# Patient Record
Sex: Male | Born: 1970
Health system: Southern US, Community
[De-identification: ages and names within clinical notes are randomized; demographics above are authoritative.]

## PROBLEM LIST (undated history)

## (undated) DIAGNOSIS — R61 Generalized hyperhidrosis: Secondary | ICD-10-CM

## (undated) DIAGNOSIS — F329 Major depressive disorder, single episode, unspecified: Secondary | ICD-10-CM

## (undated) DIAGNOSIS — S82402A Unspecified fracture of shaft of left fibula, initial encounter for closed fracture: Secondary | ICD-10-CM

## (undated) DIAGNOSIS — I2699 Other pulmonary embolism without acute cor pulmonale: Secondary | ICD-10-CM

## (undated) DIAGNOSIS — F1010 Alcohol abuse, uncomplicated: Secondary | ICD-10-CM

## (undated) DIAGNOSIS — Z87891 Personal history of nicotine dependence: Secondary | ICD-10-CM

## (undated) DIAGNOSIS — I82419 Acute embolism and thrombosis of unspecified femoral vein: Secondary | ICD-10-CM

## (undated) DIAGNOSIS — F419 Anxiety disorder, unspecified: Secondary | ICD-10-CM

## (undated) DIAGNOSIS — S0285XA Fracture of orbit, unspecified, initial encounter for closed fracture: Secondary | ICD-10-CM

## (undated) DIAGNOSIS — G4733 Obstructive sleep apnea (adult) (pediatric): Secondary | ICD-10-CM

## (undated) DIAGNOSIS — E781 Pure hyperglyceridemia: Secondary | ICD-10-CM

## (undated) HISTORY — DX: Alcohol abuse, uncomplicated: F10.10

## (undated) HISTORY — DX: Fracture of orbit, unspecified, initial encounter for closed fracture: S02.85XA

## (undated) HISTORY — DX: Pure hyperglyceridemia: E78.1

## (undated) HISTORY — PX: VASECTOMY: SHX75

## (undated) HISTORY — DX: Unspecified fracture of shaft of left fibula, initial encounter for closed fracture: S82.402A

## (undated) HISTORY — DX: Generalized hyperhidrosis: R61

## (undated) HISTORY — DX: Obstructive sleep apnea (adult) (pediatric): G47.33

## (undated) HISTORY — PX: TONSILECTOMY, ADENOIDECTOMY, BILATERAL MYRINGOTOMY AND TUBES: SHX2538

## (undated) HISTORY — DX: Anxiety disorder, unspecified: F41.9

## (undated) HISTORY — DX: Personal history of nicotine dependence: Z87.891

## (undated) HISTORY — DX: Major depressive disorder, single episode, unspecified: F32.9

---
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
## 2012-09-27 DIAGNOSIS — F32A Depression, unspecified: Secondary | ICD-10-CM

## 2012-09-27 HISTORY — DX: Depression, unspecified: F32.A

## 2013-04-06 ENCOUNTER — Ambulatory Visit (INDEPENDENT_AMBULATORY_CARE_PROVIDER_SITE_OTHER): Payer: BC Managed Care – PPO | Admitting: Family Medicine

## 2013-04-06 ENCOUNTER — Encounter: Payer: Self-pay | Admitting: Family Medicine

## 2013-04-06 VITALS — BP 122/83 | HR 79 | Temp 99.8°F | Resp 18 | Ht 71.5 in | Wt 183.0 lb

## 2013-04-06 DIAGNOSIS — R5383 Other fatigue: Secondary | ICD-10-CM

## 2013-04-06 DIAGNOSIS — R61 Generalized hyperhidrosis: Secondary | ICD-10-CM

## 2013-04-06 DIAGNOSIS — L74519 Primary focal hyperhidrosis, unspecified: Secondary | ICD-10-CM

## 2013-04-06 DIAGNOSIS — R5381 Other malaise: Secondary | ICD-10-CM

## 2013-04-06 DIAGNOSIS — F321 Major depressive disorder, single episode, moderate: Secondary | ICD-10-CM

## 2013-04-06 LAB — CBC WITH DIFFERENTIAL/PLATELET
Basophils Absolute: 0 10*3/uL (ref 0.0–0.1)
Eosinophils Absolute: 0.1 10*3/uL (ref 0.0–0.7)
HCT: 47.8 % (ref 39.0–52.0)
Lymphs Abs: 1.7 10*3/uL (ref 0.7–4.0)
MCHC: 33.6 g/dL (ref 30.0–36.0)
MCV: 94.1 fl (ref 78.0–100.0)
Monocytes Absolute: 0.8 10*3/uL (ref 0.1–1.0)
Neutrophils Relative %: 74.3 % (ref 43.0–77.0)
Platelets: 230 10*3/uL (ref 150.0–400.0)
RDW: 12.9 % (ref 11.5–14.6)

## 2013-04-06 LAB — COMPREHENSIVE METABOLIC PANEL
ALT: 14 U/L (ref 0–53)
Albumin: 4.6 g/dL (ref 3.5–5.2)
Alkaline Phosphatase: 61 U/L (ref 39–117)
Potassium: 3.9 mEq/L (ref 3.5–5.1)
Sodium: 138 mEq/L (ref 135–145)
Total Bilirubin: 0.7 mg/dL (ref 0.3–1.2)
Total Protein: 7.7 g/dL (ref 6.0–8.3)

## 2013-04-06 LAB — TSH: TSH: 0.59 u[IU]/mL (ref 0.35–5.50)

## 2013-04-06 MED ORDER — CITALOPRAM HYDROBROMIDE 20 MG PO TABS: 20.0000 mg | ORAL_TABLET | Freq: Every day | ORAL | Status: DC

## 2013-04-06 NOTE — Progress Notes (Addendum)
Office Note 05/06/2013  CC:  Chief Complaint  Patient presents with  . Establish Care    HPI:  Tyrone Page is a 42 y.o. White male who is here to establish care and get CPE. Patient's most recent primary MD: none Old records were not reviewed prior to or during today's visit.  Due to significant problems discussed today we changed today's focus to this rather than a CPE/health maintenance exam.  Complains of onset of depressed/sad mood on a regular basis about 2 yrs ago.  Worsening/peaking lately.  Very irritable some days, +anhedonia, decreased focus/attention, poor sleep initiation and maintenance, no sex drive (but denies erectile dysfunction).  No periods of hypomania or mania.   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   Also reports hx of excessive sweating "all over" chronically. Denies any hx of being on an antidepressant or seeing psychiatrist or counselor.  No SI or HI.  Past Medical History  Diagnosis Date  . Alcohol abuse     in his 71s  . History of tobacco abuse     e cig use    Past Surgical History  Procedure Laterality Date  . Tonsilectomy, adenoidectomy, bilateral myringotomy and tubes    . Vasectomy      Family History  Problem Relation Age of Onset  . COPD Mother   . Diabetes Maternal Grandmother   . Cancer Maternal Grandfather     History   Social History  . Marital Status: Married    Spouse Name: N/A    Number of Children: N/A  . Years of Education: N/A   Occupational History  . Not on file.   Social History Main Topics  . Smoking status: Former Smoker    Quit date: 11/08/2011  . Smokeless tobacco: Never Used  . Alcohol Use: Yes  . Drug Use: Yes    Special: Marijuana  . Sexually Active: Not on file   Other Topics Concern  . Not on file   Social History Narrative   Married, 3 children.   Orig from [place].   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   Former smoker: quit 2012.  40 pack-yr hx.   Rare alcohol currently--history of  alc abuse in 43s (multiple DUI's, etc).  Marijuana smoker his whole life--evenings now, for relaxation and sleep.   Occupation: Arboriculturist.   MEDS: none  No Known Allergies  ROS Review of Systems  Constitutional: Positive for fatigue. Negative for fever.  HENT: Negative for congestion and sore throat.   Eyes: Negative for visual disturbance.  Respiratory: Negative for cough.   Cardiovascular: Negative for chest pain.  Gastrointestinal: Negative for nausea and abdominal pain.  Genitourinary: Negative for dysuria.  Musculoskeletal: Negative for back pain and joint swelling.  Skin: Negative for rash.  Neurological: Negative for weakness and headaches.  Hematological: Negative for adenopathy.     PE; Blood pressure 122/83, pulse 79, temperature 99.8 F (37.7 C), temperature source Temporal, resp. rate 18, height 5' 11.5" (1.816 m), weight 183 lb (83.008 kg), SpO2 99.00%. EXAM Pertinent labs:  None today  ASSESSMENT AND PLAN:   New Pt today: no old records to obtain.  Major depressive disorder, single episode, moderate Citalopram 20mg  po qd.  Therapeutic expectations and side effect profile of medication discussed today.  Patient's questions answered. Check TSH, CMET, CBC. Recheck 1 mo.  An After Visit Summary was printed and given to the patient.  Return in about 1 month (around 05/07/2013) for f/u  depression.

## 2013-04-26 DIAGNOSIS — R5383 Other fatigue: Secondary | ICD-10-CM | POA: Insufficient documentation

## 2013-04-26 DIAGNOSIS — R61 Generalized hyperhidrosis: Secondary | ICD-10-CM | POA: Insufficient documentation

## 2013-04-26 DIAGNOSIS — F321 Major depressive disorder, single episode, moderate: Secondary | ICD-10-CM | POA: Insufficient documentation

## 2013-05-04 ENCOUNTER — Ambulatory Visit (INDEPENDENT_AMBULATORY_CARE_PROVIDER_SITE_OTHER): Payer: BC Managed Care – PPO | Admitting: Family Medicine

## 2013-05-04 ENCOUNTER — Encounter: Payer: Self-pay | Admitting: Family Medicine

## 2013-05-04 VITALS — BP 127/78 | HR 65 | Temp 97.6°F | Resp 16 | Ht 71.5 in | Wt 185.0 lb

## 2013-05-04 DIAGNOSIS — F321 Major depressive disorder, single episode, moderate: Secondary | ICD-10-CM

## 2013-05-04 MED ORDER — CITALOPRAM HYDROBROMIDE 20 MG PO TABS: 20.0000 mg | ORAL_TABLET | Freq: Every day | ORAL | Status: DC

## 2013-05-04 NOTE — Progress Notes (Signed)
OFFICE NOTE  05/06/2013  CC:  Chief Complaint  Patient presents with  . Follow-up    depression     HPI: Patient is a 42 y.o. Caucasian male who is here for 1 mo f/u depression-started citalopram 20mg  qd last visit. Feels better regarding sleep and feels more clear headed and focused, relaxed more.  Felt onset of improvement in about a week.  No euphoria or excess energy or irritability to suggest hypomania or mania. Having some diarrhea: had 2-3 watery BMs per day for the first week.  In the last 3-4 days it has let up some-getting more formed and having only one per day now. Coincidentally has had more right sided neck muscle pain and stiffness.  Pertinent PMH:  Past Medical History  Diagnosis Date  . Alcohol abuse     in his 3s  . History of tobacco abuse     e cig use  . Hyperhydrosis disorder   . Depression 2014   Past surgical, social, and family history reviewed and no changes noted since last office visit.  MEDS:  Outpatient Prescriptions Prior to Visit  Medication Sig Dispense Refill  . citalopram (CELEXA) 20 MG tablet Take 1 tablet (20 mg total) by mouth daily.  30 tablet  1   No facility-administered medications prior to visit.    PE: Blood pressure 127/78, pulse 65, temperature 97.6 F (36.4 C), temperature source Temporal, resp. rate 16, height 5' 11.5" (1.816 m), weight 185 lb (83.915 kg), SpO2 98.00%. Wt Readings from Last 2 Encounters:  05/04/13 185 lb (83.915 kg)  04/06/13 183 lb (83.008 kg)    Gen: alert, oriented x 4, affect pleasant.  Lucid thinking and conversation noted. HEENT: PERRLA, EOMI.   Neck: no LAD, mass, or thyromegaly.  ROM fully intact, nontender neck soft tissues. CV: RRR, no m/r/g LUNGS: CTA bilat, nonlabored. NEURO: no tremor or tics noted on observation.  Coordination intact. CN 2-12 grossly intact bilaterally, strength 5/5 in all extremeties.  No ataxia.   IMPRESSION AND PLAN:  Major depressive disorder, single episode,  moderate Much improved. Continue citalopram 20 mg qd. Recheck in 2 mo for CPE and fasting labs.

## 2013-05-06 ENCOUNTER — Encounter: Payer: Self-pay | Admitting: Family Medicine

## 2013-05-06 NOTE — Assessment & Plan Note (Signed)
Citalopram 20mg  po qd.  Therapeutic expectations and side effect profile of medication discussed today.  Patient's questions answered. Check TSH, CMET, CBC. Recheck 1 mo.

## 2013-05-06 NOTE — Assessment & Plan Note (Signed)
Much improved. Continue citalopram 20 mg qd. Recheck in 2 mo for CPE and fasting labs.

## 2013-07-04 ENCOUNTER — Encounter: Payer: Self-pay | Admitting: Family Medicine

## 2013-07-04 ENCOUNTER — Ambulatory Visit (INDEPENDENT_AMBULATORY_CARE_PROVIDER_SITE_OTHER): Payer: BC Managed Care – PPO | Admitting: Family Medicine

## 2013-07-04 VITALS — BP 139/78 | HR 65 | Temp 98.1°F | Resp 16 | Ht 71.5 in | Wt 182.0 lb

## 2013-07-04 DIAGNOSIS — R7989 Other specified abnormal findings of blood chemistry: Secondary | ICD-10-CM

## 2013-07-04 DIAGNOSIS — Z0000 Encounter for general adult medical examination without abnormal findings: Secondary | ICD-10-CM

## 2013-07-04 LAB — LIPID PANEL
Cholesterol: 224 mg/dL — ABNORMAL HIGH (ref 0–200)
Total CHOL/HDL Ratio: 4

## 2013-07-04 NOTE — Progress Notes (Signed)
Office Note 07/04/2013  CC:  Chief Complaint  Patient presents with  . Annual Exam    HPI:  Tyrone Page is a 42 y.o. White male who is here for CPE. Feels better than he has in a couple of years and he attributes this to the citalopram.  No side effects.   Past Medical History  Diagnosis Date  . Alcohol abuse     in his 58s  . History of tobacco abuse     e cig use  . Hyperhydrosis disorder   . Depression 2014    Past Surgical History  Procedure Laterality Date  . Tonsilectomy, adenoidectomy, bilateral myringotomy and tubes    . Vasectomy      Family History  Problem Relation Age of Onset  . COPD Mother   . Diabetes Maternal Grandmother   . Cancer Maternal Grandfather     History   Social History  . Marital Status: Married    Spouse Name: N/A    Number of Children: N/A  . Years of Education: N/A   Occupational History  . Not on file.   Social History Main Topics  . Smoking status: Former Smoker    Quit date: 11/08/2011  . Smokeless tobacco: Never Used  . Alcohol Use: Yes  . Drug Use: Yes    Special: Marijuana  . Sexual Activity: Not on file   Other Topics Concern  . Not on file   Social History Narrative   Married, 3 children.   Orig from [place].   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   Former smoker: quit 2012.  40 pack-yr hx.   Rare alcohol currently--history of alc abuse in 51s (multiple DUI's, etc).  Marijuana smoker his whole life--evenings now, for relaxation and sleep.   Occupation: Arboriculturist.    Outpatient Prescriptions Prior to Visit  Medication Sig Dispense Refill  . citalopram (CELEXA) 20 MG tablet Take 1 tablet (20 mg total) by mouth daily.  30 tablet  6   No facility-administered medications prior to visit.    No Known Allergies  ROS Review of Systems  Constitutional: Negative for fever, chills, appetite change and fatigue.  HENT: Negative for congestion, dental problem, ear pain and sore throat.    Eyes: Negative for discharge, redness and visual disturbance.  Respiratory: Negative for cough, chest tightness, shortness of breath and wheezing.   Cardiovascular: Negative for chest pain, palpitations and leg swelling.  Gastrointestinal: Negative for nausea, vomiting, abdominal pain, diarrhea and blood in stool.  Genitourinary: Negative for dysuria, urgency, frequency, hematuria, flank pain and difficulty urinating.  Musculoskeletal: Negative for arthralgias, back pain, joint swelling, myalgias and neck stiffness.  Skin: Negative for pallor and rash.  Neurological: Negative for dizziness, speech difficulty, weakness and headaches.  Hematological: Negative for adenopathy. Does not bruise/bleed easily.  Psychiatric/Behavioral: Positive for sleep disturbance (gets up one time per night to urinate and has trouble getting back to sleep at times). Negative for confusion. The patient is not nervous/anxious.      PE; Blood pressure 139/78, pulse 65, temperature 98.1 F (36.7 C), temperature source Temporal, resp. rate 16, height 5' 11.5" (1.816 m), weight 182 lb (82.555 kg), SpO2 100.00%. Gen: Alert, well appearing.  Patient is oriented to person, place, time, and situation. AFFECT: pleasant, lucid thought and speech. ENT: Ears: EACs clear, normal epithelium.  TMs with good light reflex and landmarks bilaterally.  Eyes: no injection, icteris, swelling, or exudate.  EOMI, PERRLA. Nose: no drainage or turbinate edema/swelling.  No injection or focal lesion.  Mouth: lips without lesion/swelling.  Oral mucosa pink and moist.  Dentition intact and without obvious caries or gingival swelling.  Oropharynx without erythema, exudate, or swelling.  Neck: supple/nontender.  No LAD, mass, or TM.  Carotid pulses 2+ bilaterally, without bruits. CV: RRR, no m/r/g.   LUNGS: CTA bilat, nonlabored resps, good aeration in all lung fields. ABD: soft, NT, ND, BS normal.  No hepatospenomegaly or mass.  No bruits. EXT:  no clubbing, cyanosis, or edema.  Musculoskeletal: no joint swelling, erythema, warmth, or tenderness.  ROM of all joints intact. Skin - no sores or suspicious lesions or rashes or color changes Neuro: CN 2-12 intact bilaterally, strength 5/5 in proximal and distal upper extremities and lower extremities bilaterally.  No sensory deficits.  No tremor.  No disdiadochokinesis.  No ataxia.  Upper extremity and lower extremity DTRs symmetric.  No pronator drift. GU: deferred  Pertinent labs:  Lab Results  Component Value Date   WBC 10.4 04/06/2013   HGB 16.1 04/06/2013   HCT 47.8 04/06/2013   MCV 94.1 04/06/2013   PLT 230.0 04/06/2013     Chemistry      Component Value Date/Time   NA 138 04/06/2013 1038   K 3.9 04/06/2013 1038   CL 103 04/06/2013 1038   CO2 26 04/06/2013 1038   BUN 17 04/06/2013 1038   CREATININE 0.9 04/06/2013 1038      Component Value Date/Time   CALCIUM 9.5 04/06/2013 1038   ALKPHOS 61 04/06/2013 1038   AST 19 04/06/2013 1038   ALT 14 04/06/2013 1038   BILITOT 0.7 04/06/2013 1038     Lab Results  Component Value Date   TSH 0.59 04/06/2013     ASSESSMENT AND PLAN:   Health maintenance examination Reviewed age and gender appropriate health maintenance issues (prudent diet, regular exercise, health risks of tobacco and excessive alcohol, use of seatbelts, fire alarms in home, use of sunscreen).  Also reviewed age and gender appropriate health screening as well as vaccine recommendations. He declined flu vaccine today. He quit smoking 2 yrs ago--says he still uses e-cig occ to curb cravings--but feels GREAT since quitting. Recent CBC, CMET, and TSH in 03/2013 wnl.   FLP done today.   An After Visit Summary was printed and given to the patient.  FOLLOW UP:  Return in about 6 months (around 01/02/2014) for f/u anx/dep.

## 2013-07-04 NOTE — Patient Instructions (Signed)
Health Maintenance, Males A healthy lifestyle and preventative care can promote health and wellness.  Maintain regular health, dental, and eye exams.  Eat a healthy diet. Foods like vegetables, fruits, whole grains, low-fat dairy products, and lean protein foods contain the nutrients you need without too many calories. Decrease your intake of foods high in solid fats, added sugars, and salt. Get information about a proper diet from your caregiver, if necessary.  Regular physical exercise is one of the most important things you can do for your health. Most adults should get at least 150 minutes of moderate-intensity exercise (any activity that increases your heart rate and causes you to sweat) each week. In addition, most adults need muscle-strengthening exercises on 2 or more days a week.   Maintain a healthy weight. The body mass index (BMI) is a screening tool to identify possible weight problems. It provides an estimate of body fat based on height and weight. Your caregiver can help determine your BMI, and can help you achieve or maintain a healthy weight. For adults 20 years and older:  A BMI below 18.5 is considered underweight.  A BMI of 18.5 to 24.9 is normal.  A BMI of 25 to 29.9 is considered overweight.  A BMI of 30 and above is considered obese.  Maintain normal blood lipids and cholesterol by exercising and minimizing your intake of saturated fat. Eat a balanced diet with plenty of fruits and vegetables. Blood tests for lipids and cholesterol should begin at age 20 and be repeated every 5 years. If your lipid or cholesterol levels are high, you are over 50, or you are a high risk for heart disease, you may need your cholesterol levels checked more frequently.Ongoing high lipid and cholesterol levels should be treated with medicines, if diet and exercise are not effective.  If you smoke, find out from your caregiver how to quit. If you do not use tobacco, do not start.  If you  choose to drink alcohol, do not exceed 2 drinks per day. One drink is considered to be 12 ounces (355 mL) of beer, 5 ounces (148 mL) of wine, or 1.5 ounces (44 mL) of liquor.  Avoid use of street drugs. Do not share needles with anyone. Ask for help if you need support or instructions about stopping the use of drugs.  High blood pressure causes heart disease and increases the risk of stroke. Blood pressure should be checked at least every 1 to 2 years. Ongoing high blood pressure should be treated with medicines if weight loss and exercise are not effective.  If you are 45 to 43 years old, ask your caregiver if you should take aspirin to prevent heart disease.  Diabetes screening involves taking a blood sample to check your fasting blood sugar level. This should be done once every 3 years, after age 45, if you are within normal weight and without risk factors for diabetes. Testing should be considered at a younger age or be carried out more frequently if you are overweight and have at least 1 risk factor for diabetes.  Colorectal cancer can be detected and often prevented. Most routine colorectal cancer screening begins at the age of 50 and continues through age 75. However, your caregiver may recommend screening at an earlier age if you have risk factors for colon cancer. On a yearly basis, your caregiver may provide home test kits to check for hidden blood in the stool. Use of a small camera at the end of a tube,   to directly examine the colon (sigmoidoscopy or colonoscopy), can detect the earliest forms of colorectal cancer. Talk to your caregiver about this at age 50, when routine screening begins. Direct examination of the colon should be repeated every 5 to 10 years through age 75, unless early forms of pre-cancerous polyps or small growths are found.  Hepatitis C blood testing is recommended for all people born from 1945 through 1965 and any individual with known risks for hepatitis C.  Healthy  men should no longer receive prostate-specific antigen (PSA) blood tests as part of routine cancer screening. Consult with your caregiver about prostate cancer screening.  Testicular cancer screening is not recommended for adolescents or adult males who have no symptoms. Screening includes self-exam, caregiver exam, and other screening tests. Consult with your caregiver about any symptoms you have or any concerns you have about testicular cancer.  Practice safe sex. Use condoms and avoid high-risk sexual practices to reduce the spread of sexually transmitted infections (STIs).  Use sunscreen with a sun protection factor (SPF) of 30 or greater. Apply sunscreen liberally and repeatedly throughout the day. You should seek shade when your shadow is shorter than you. Protect yourself by wearing long sleeves, pants, a wide-brimmed hat, and sunglasses year round, whenever you are outdoors.  Notify your caregiver of new moles or changes in moles, especially if there is a change in shape or color. Also notify your caregiver if a mole is larger than the size of a pencil eraser.  A one-time screening for abdominal aortic aneurysm (AAA) and surgical repair of large AAAs by sound wave imaging (ultrasonography) is recommended for ages 65 to 75 years who are current or former smokers.  Stay current with your immunizations. Document Released: 03/11/2008 Document Revised: 12/06/2011 Document Reviewed: 02/08/2011 [employer] Patient Information 2014 [employer].  

## 2013-07-04 NOTE — Assessment & Plan Note (Signed)
Reviewed age and gender appropriate health maintenance issues (prudent diet, regular exercise, health risks of tobacco and excessive alcohol, use of seatbelts, fire alarms in home, use of sunscreen).  Also reviewed age and gender appropriate health screening as well as vaccine recommendations. He declined flu vaccine today. He quit smoking 2 yrs ago--says he still uses e-cig occ to curb cravings--but feels GREAT since quitting. Recent CBC, CMET, and TSH in 03/2013 wnl.   FLP done today.

## 2013-07-28 DIAGNOSIS — S0285XA Fracture of orbit, unspecified, initial encounter for closed fracture: Secondary | ICD-10-CM

## 2013-07-28 HISTORY — DX: Fracture of orbit, unspecified, initial encounter for closed fracture: S02.85XA

## 2013-08-02 ENCOUNTER — Other Ambulatory Visit: Payer: Self-pay

## 2013-08-18 ENCOUNTER — Encounter (HOSPITAL_COMMUNITY): Payer: Self-pay | Admitting: Emergency Medicine

## 2013-08-18 ENCOUNTER — Emergency Department (INDEPENDENT_AMBULATORY_CARE_PROVIDER_SITE_OTHER)
Admission: EM | Admit: 2013-08-18 | Discharge: 2013-08-18 | Disposition: A | Discharge status: Nursing Home (Includes ICF) | Payer: BC Managed Care – PPO | Source: Home / Self Care

## 2013-08-18 ENCOUNTER — Emergency Department (HOSPITAL_COMMUNITY): Payer: BC Managed Care – PPO

## 2013-08-18 ENCOUNTER — Emergency Department (HOSPITAL_COMMUNITY)
Admission: EM | Admit: 2013-08-18 | Discharge: 2013-08-18 | Disposition: A | Discharge status: Home / Self Care | Payer: BC Managed Care – PPO | Attending: Emergency Medicine | Admitting: Emergency Medicine

## 2013-08-18 DIAGNOSIS — L74519 Primary focal hyperhidrosis, unspecified: Secondary | ICD-10-CM | POA: Insufficient documentation

## 2013-08-18 DIAGNOSIS — Z87891 Personal history of nicotine dependence: Secondary | ICD-10-CM | POA: Insufficient documentation

## 2013-08-18 DIAGNOSIS — H1130 Conjunctival hemorrhage, unspecified eye: Secondary | ICD-10-CM | POA: Insufficient documentation

## 2013-08-18 DIAGNOSIS — R040 Epistaxis: Secondary | ICD-10-CM | POA: Insufficient documentation

## 2013-08-18 DIAGNOSIS — S0511XA Contusion of eyeball and orbital tissues, right eye, initial encounter: Secondary | ICD-10-CM

## 2013-08-18 DIAGNOSIS — S0230XA Fracture of orbital floor, unspecified side, initial encounter for closed fracture: Secondary | ICD-10-CM | POA: Insufficient documentation

## 2013-08-18 DIAGNOSIS — R112 Nausea with vomiting, unspecified: Secondary | ICD-10-CM | POA: Insufficient documentation

## 2013-08-18 DIAGNOSIS — H53149 Visual discomfort, unspecified: Secondary | ICD-10-CM

## 2013-08-18 DIAGNOSIS — R209 Unspecified disturbances of skin sensation: Secondary | ICD-10-CM | POA: Insufficient documentation

## 2013-08-18 DIAGNOSIS — R51 Headache: Secondary | ICD-10-CM | POA: Insufficient documentation

## 2013-08-18 DIAGNOSIS — F1010 Alcohol abuse, uncomplicated: Secondary | ICD-10-CM | POA: Insufficient documentation

## 2013-08-18 DIAGNOSIS — S023XXA Fracture of orbital floor, initial encounter for closed fracture: Secondary | ICD-10-CM

## 2013-08-18 DIAGNOSIS — S02839A Fracture of medial orbital wall, unspecified side, initial encounter for closed fracture: Secondary | ICD-10-CM

## 2013-08-18 DIAGNOSIS — F329 Major depressive disorder, single episode, unspecified: Secondary | ICD-10-CM | POA: Insufficient documentation

## 2013-08-18 DIAGNOSIS — Z79899 Other long term (current) drug therapy: Secondary | ICD-10-CM | POA: Insufficient documentation

## 2013-08-18 DIAGNOSIS — F3289 Other specified depressive episodes: Secondary | ICD-10-CM | POA: Insufficient documentation

## 2013-08-18 MED ORDER — OXYCODONE-ACETAMINOPHEN 5-325 MG PO TABS: 1.0000 | ORAL_TABLET | Freq: Four times a day (QID) | ORAL | Status: DC | PRN

## 2013-08-18 MED ORDER — ONDANSETRON HCL 4 MG PO TABS: 4.0000 mg | ORAL_TABLET | Freq: Four times a day (QID) | ORAL | Status: DC

## 2013-08-18 MED ORDER — FLUORESCEIN SODIUM 1 MG OP STRP
1.0000 | ORAL_STRIP | Freq: Once | OPHTHALMIC | Status: AC
  Filled 2013-08-18: qty 1

## 2013-08-18 MED ORDER — ONDANSETRON 4 MG PO TBDP
4.0000 mg | ORAL_TABLET | Freq: Once | ORAL | Status: AC
  Administered 2013-08-18: 4 mg via ORAL
  Filled 2013-08-18: qty 1

## 2013-08-18 MED ORDER — TETRACAINE HCL 0.5 % OP SOLN
1.0000 [drp] | Freq: Once | OPHTHALMIC | Status: AC
  Administered 2013-08-18: 1 [drp] via OPHTHALMIC
  Filled 2013-08-18: qty 2

## 2013-08-18 MED ORDER — OXYCODONE-ACETAMINOPHEN 5-325 MG PO TABS
2.0000 | ORAL_TABLET | Freq: Once | ORAL | Status: AC
  Administered 2013-08-18: 2 via ORAL
  Filled 2013-08-18: qty 2
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
## 2013-08-18 NOTE — ED Provider Notes (Signed)
Medical screening examination/treatment/procedure(s) were performed by non-physician practitioner and as supervising physician I was immediately available for consultation/collaboration.  EKG Interpretation   None         Richardean Canal, MD 08/18/13 2200
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
## 2013-08-19 NOTE — ED Provider Notes (Signed)
Medical screening examination/treatment/procedure(s) were conducted as a shared visit with non-physician practitioner(s) and myself.  I personally evaluated the patient during the encounter.  EKG Interpretation   None         Joya Gaskins, MD 08/19/13 [phone]

## 2013-08-22 ENCOUNTER — Encounter: Payer: Self-pay | Admitting: Family Medicine

## 2013-08-22 ENCOUNTER — Ambulatory Visit (HOSPITAL_BASED_OUTPATIENT_CLINIC_OR_DEPARTMENT_OTHER)
Admission: RE | Admit: 2013-08-22 | Discharge: 2013-08-22 | Disposition: A | Discharge status: Home / Self Care | Payer: BC Managed Care – PPO | Source: Ambulatory Visit | Attending: Family Medicine | Admitting: Family Medicine

## 2013-08-22 ENCOUNTER — Ambulatory Visit (INDEPENDENT_AMBULATORY_CARE_PROVIDER_SITE_OTHER): Payer: BC Managed Care – PPO | Admitting: Family Medicine

## 2013-08-22 ENCOUNTER — Telehealth: Payer: Self-pay | Admitting: *Deleted

## 2013-08-22 VITALS — BP 107/67 | HR 79 | Temp 98.0°F | Ht 71.5 in | Wt 177.8 lb

## 2013-08-22 DIAGNOSIS — Z87891 Personal history of nicotine dependence: Secondary | ICD-10-CM | POA: Insufficient documentation

## 2013-08-22 DIAGNOSIS — R079 Chest pain, unspecified: Secondary | ICD-10-CM | POA: Insufficient documentation

## 2013-08-22 DIAGNOSIS — R0781 Pleurodynia: Secondary | ICD-10-CM

## 2013-08-22 MED ORDER — OXYCODONE-ACETAMINOPHEN 5-325 MG PO TABS: 1.0000 | ORAL_TABLET | Freq: Four times a day (QID) | ORAL | Status: DC | PRN
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
## 2013-08-24 DIAGNOSIS — R0781 Pleurodynia: Secondary | ICD-10-CM | POA: Insufficient documentation

## 2013-08-24 NOTE — Assessment & Plan Note (Signed)
Contusion suspected. R/o rib fracture with plain film of ribs today. Percocet 5/325, 1-2 tabs po q6h prn, #40, no RF.

## 2013-08-24 NOTE — Telephone Encounter (Signed)
Error

## 2013-08-27 ENCOUNTER — Telehealth: Payer: Self-pay | Admitting: Family Medicine

## 2013-08-27 MED ORDER — CYCLOBENZAPRINE HCL 10 MG PO TABS: 10.0000 mg | ORAL_TABLET | Freq: Three times a day (TID) | ORAL | Status: DC | PRN

## 2013-08-27 NOTE — Telephone Encounter (Signed)
Patients wife aware

## 2013-08-27 NOTE — Telephone Encounter (Signed)
Flexeril rx sent to pharmacy. Pls make pt/wife aware that combining oxycodone with flexeril will potentially be much more sedating/impairing.  He may want to separate dosing of these by at least a few hours.  --thx

## 2013-08-27 NOTE — Telephone Encounter (Signed)
Patient's wife called requesting a muscle relaxer for patient's back pain.  Please advise.

## 2013-10-08 ENCOUNTER — Encounter: Payer: Self-pay | Admitting: Family Medicine

## 2013-10-08 ENCOUNTER — Ambulatory Visit (INDEPENDENT_AMBULATORY_CARE_PROVIDER_SITE_OTHER): Payer: BC Managed Care – PPO | Admitting: Family Medicine

## 2013-10-08 VITALS — BP 157/75 | HR 89 | Temp 98.6°F | Resp 18 | Ht 71.5 in | Wt 175.0 lb

## 2013-10-08 DIAGNOSIS — F4323 Adjustment disorder with mixed anxiety and depressed mood: Secondary | ICD-10-CM

## 2013-10-08 MED ORDER — CITALOPRAM HYDROBROMIDE 20 MG PO TABS: 20.0000 mg | ORAL_TABLET | Freq: Every day | ORAL | Status: DC

## 2013-10-08 MED ORDER — CLONAZEPAM 1 MG PO TABS: ORAL_TABLET | ORAL | Status: DC

## 2013-10-08 NOTE — Progress Notes (Signed)
Pre visit review using our clinic review tool, if applicable. No additional management support is needed unless otherwise documented below in the visit note. 
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
## 2013-10-30 ENCOUNTER — Ambulatory Visit (INDEPENDENT_AMBULATORY_CARE_PROVIDER_SITE_OTHER): Payer: BC Managed Care – PPO | Admitting: Family Medicine

## 2013-10-30 ENCOUNTER — Encounter: Payer: Self-pay | Admitting: Family Medicine

## 2013-10-30 VITALS — BP 123/77 | HR 66 | Temp 98.3°F | Resp 18 | Ht 71.5 in | Wt 182.0 lb

## 2013-10-30 DIAGNOSIS — F4323 Adjustment disorder with mixed anxiety and depressed mood: Secondary | ICD-10-CM

## 2013-10-30 NOTE — Progress Notes (Signed)
Pre visit review using our clinic review tool, if applicable. No additional management support is needed unless otherwise documented below in the visit note. 
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
## 2013-12-05 ENCOUNTER — Telehealth: Payer: Self-pay | Admitting: Family Medicine

## 2013-12-05 ENCOUNTER — Other Ambulatory Visit: Payer: Self-pay | Admitting: Family Medicine

## 2013-12-05 MED ORDER — CLONAZEPAM 1 MG PO TABS: ORAL_TABLET | ORAL | Status: DC

## 2013-12-05 NOTE — Telephone Encounter (Signed)
OK to RF as previously prescribed, with 5 additional RFs.-thx 

## 2013-12-05 NOTE — Telephone Encounter (Signed)
Rx printed and faxed to [facility] pharmacy.   Patient's wife aware.

## 2013-12-05 NOTE — Telephone Encounter (Signed)
Patient's wife called requesting refill of pt's klonopin.  Patient last seen 10/30/13.  Last rx was 10/08/13 x 1 refill.  Please advise refill.

## 2013-12-27 ENCOUNTER — Other Ambulatory Visit: Payer: Self-pay | Admitting: Family Medicine

## 2014-01-01 ENCOUNTER — Other Ambulatory Visit: Payer: Self-pay | Admitting: Family Medicine

## 2014-01-02 ENCOUNTER — Ambulatory Visit: Payer: BC Managed Care – PPO | Admitting: Family Medicine

## 2014-01-24 ENCOUNTER — Ambulatory Visit: Payer: BC Managed Care – PPO | Admitting: Family Medicine

## 2014-01-25 ENCOUNTER — Ambulatory Visit (INDEPENDENT_AMBULATORY_CARE_PROVIDER_SITE_OTHER): Payer: BC Managed Care – PPO | Admitting: Family Medicine

## 2014-01-25 ENCOUNTER — Encounter: Payer: Self-pay | Admitting: Family Medicine

## 2014-01-25 VITALS — BP 127/82 | HR 74 | Temp 98.4°F | Resp 18 | Ht 71.5 in | Wt 171.0 lb

## 2014-01-25 DIAGNOSIS — F4323 Adjustment disorder with mixed anxiety and depressed mood: Secondary | ICD-10-CM

## 2014-01-25 MED ORDER — CITALOPRAM HYDROBROMIDE 40 MG PO TABS: 40.0000 mg | ORAL_TABLET | Freq: Every day | ORAL | Status: DC

## 2014-01-25 MED ORDER — CLONAZEPAM 1 MG PO TABS: ORAL_TABLET | ORAL | Status: DC

## 2014-01-25 NOTE — Progress Notes (Signed)
Pre visit review using our clinic review tool, if applicable. No additional management support is needed unless otherwise documented below in the visit note. 
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
## 2014-01-28 ENCOUNTER — Ambulatory Visit: Payer: BC Managed Care – PPO | Admitting: Family Medicine

## 2014-02-22 ENCOUNTER — Ambulatory Visit: Payer: BC Managed Care – PPO | Admitting: Family Medicine

## 2014-03-01 ENCOUNTER — Encounter: Payer: Self-pay | Admitting: Family Medicine

## 2014-03-01 ENCOUNTER — Ambulatory Visit (INDEPENDENT_AMBULATORY_CARE_PROVIDER_SITE_OTHER): Payer: BC Managed Care – PPO | Admitting: Family Medicine

## 2014-03-01 VITALS — BP 106/55 | HR 60 | Temp 98.1°F | Resp 18 | Ht 71.5 in | Wt 167.0 lb

## 2014-03-01 DIAGNOSIS — F4323 Adjustment disorder with mixed anxiety and depressed mood: Secondary | ICD-10-CM

## 2014-03-01 DIAGNOSIS — R631 Polydipsia: Secondary | ICD-10-CM

## 2014-03-01 DIAGNOSIS — G4762 Sleep related leg cramps: Secondary | ICD-10-CM

## 2014-03-01 LAB — BASIC METABOLIC PANEL
BUN: 17 mg/dL (ref 6–23)
CHLORIDE: 106 meq/L (ref 96–112)
CO2: 28 mEq/L (ref 19–32)
Calcium: 9.1 mg/dL (ref 8.4–10.5)
Creat: 0.91 mg/dL (ref 0.50–1.35)
Glucose, Bld: 86 mg/dL (ref 70–99)
POTASSIUM: 3.8 meq/L (ref 3.5–5.3)
Sodium: 141 mEq/L (ref 135–145)
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
## 2014-03-01 NOTE — Progress Notes (Signed)
Pre visit review using our clinic review tool, if applicable. No additional management support is needed unless otherwise documented below in the visit note. 

## 2014-07-30 ENCOUNTER — Telehealth: Payer: Self-pay | Admitting: Family Medicine

## 2014-07-30 NOTE — Telephone Encounter (Signed)
Pt requesting rf for clonazepam.  Last OV was 03/01/14.  Last RX 01/25/14 x 6 rfs.  Please advise.

## 2014-07-31 MED ORDER — CLONAZEPAM 1 MG PO TABS: ORAL_TABLET | ORAL | Status: DC

## 2014-07-31 NOTE — Telephone Encounter (Signed)
Clonaz rx printed.-thx

## 2014-07-31 NOTE — Telephone Encounter (Signed)
Rx faxed

## 2014-08-30 ENCOUNTER — Other Ambulatory Visit: Payer: Self-pay | Admitting: Family Medicine

## 2014-08-30 MED ORDER — CITALOPRAM HYDROBROMIDE 40 MG PO TABS: 40.0000 mg | ORAL_TABLET | Freq: Every day | ORAL | Status: DC

## 2014-09-02 ENCOUNTER — Encounter: Payer: Self-pay | Admitting: Family Medicine

## 2014-09-02 ENCOUNTER — Ambulatory Visit (INDEPENDENT_AMBULATORY_CARE_PROVIDER_SITE_OTHER): Payer: BC Managed Care – PPO | Admitting: Family Medicine

## 2014-09-02 VITALS — BP 125/81 | HR 76 | Temp 98.0°F | Resp 18 | Ht 71.5 in | Wt 184.0 lb

## 2014-09-02 DIAGNOSIS — F4323 Adjustment disorder with mixed anxiety and depressed mood: Secondary | ICD-10-CM

## 2014-09-02 DIAGNOSIS — F321 Major depressive disorder, single episode, moderate: Secondary | ICD-10-CM

## 2014-09-02 MED ORDER — CLONAZEPAM 1 MG PO TABS: ORAL_TABLET | ORAL | Status: DC

## 2014-09-02 MED ORDER — CITALOPRAM HYDROBROMIDE 20 MG PO TABS: 20.0000 mg | ORAL_TABLET | Freq: Every day | ORAL | Status: DC
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
## 2014-09-02 NOTE — Progress Notes (Signed)
Pre visit review using our clinic review tool, if applicable. No additional management support is needed unless otherwise documented below in the visit note. 

## 2014-12-13 ENCOUNTER — Ambulatory Visit (INDEPENDENT_AMBULATORY_CARE_PROVIDER_SITE_OTHER): Payer: 59 | Admitting: Family Medicine

## 2014-12-13 ENCOUNTER — Encounter: Payer: Self-pay | Admitting: Family Medicine

## 2014-12-13 VITALS — BP 99/65 | HR 75 | Temp 98.4°F | Ht 71.5 in | Wt 183.0 lb

## 2014-12-13 DIAGNOSIS — F410 Panic disorder [episodic paroxysmal anxiety] without agoraphobia: Secondary | ICD-10-CM

## 2014-12-13 DIAGNOSIS — F411 Generalized anxiety disorder: Secondary | ICD-10-CM | POA: Insufficient documentation

## 2014-12-13 MED ORDER — CITALOPRAM HYDROBROMIDE 40 MG PO TABS: 40.0000 mg | ORAL_TABLET | Freq: Every day | ORAL | Status: DC

## 2014-12-13 NOTE — Progress Notes (Signed)
OFFICE NOTE  12/13/2014  CC: "wants to discuss meds"  HPI: Patient is a 44 y.o. Caucasian male who is here for "wants to discuss medications". Says that ever since he decreased his citalopram from 40mg  down to 20mg  qd he has felt increased anxiety/stress, can't seem to focus, can't calm down when things get him angry/upset.  Gets almost panicky at times like he used to.  It started after about 2 weeks of being on the decreased dose per his report, but he says his wife has been complaining ever since a few days after his dose change.  He had very well controlled depression and anxiety on cital 40mg  for a long time but a couple months ago wanted to try to slowly get off the med so this is when we changed him to 20mg  qd citalopram.  No depressed mood or hopelessness.  No SI or HI. His wife forced him to come back here today and "get back on 40mg  citalopram again".  Still taking clonazepam 1 tab bid.  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 No family conflicts but he is somewhat stressed out about his daughter's upcoming scoliosis surgery.  Pertinent PMH:  Past medical, surgical, social, and family history reviewed and no changes are noted since last office visit.  MEDS:  Outpatient Prescriptions Prior to Visit  Medication Sig Dispense Refill  . citalopram (CELEXA) 20 MG tablet Take 1 tablet (20 mg total) by mouth daily. 30 tablet 4  . clonazePAM (KLONOPIN) 1 MG tablet 1 tab po bid prn anxiety 60 tablet 5   No facility-administered medications prior to visit.    PE: Blood pressure 99/65, pulse 75, temperature 98.4 F (36.9 C), temperature source Temporal, height 5' 11.5" (1.816 m), weight 183 lb (83.008 kg), SpO2 94 %. Wt Readings from Last 2 Encounters:  12/13/14 183 lb (83.008 kg)  09/02/14 184 lb (83.462 kg)    Gen: alert, oriented x 4, affect pleasant.  Lucid thinking and conversation noted. HEENT: PERRLA, EOMI.   Neck: no LAD, mass, or thyromegaly. CV: RRR, no m/r/g LUNGS: CTA  bilat, nonlabored. NEURO: no tremor or tics noted on observation.  Coordination intact. CN 2-12 grossly intact bilaterally, strength 5/5 in all extremeties.  No ataxia.   IMPRESSION AND PLAN:  GAD with panic, hx of major depression. He is having rebound anxiety since cutting dose from 40 to 20mg  qd citalopram and the symptoms are persisting. Will get him back on citalopram 40 mg qd starting today. Continue clonaz 1mg  bid: he did not need a new rx for this today.  An After Visit Summary was printed and given to the patient.  FOLLOW UP: call or return in 2-3 wks if not feeling significantly improved.  Otherwise, f/u 6 mo.

## 2014-12-13 NOTE — Progress Notes (Signed)
Pre visit review using our clinic review tool, if applicable. No additional management support is needed unless otherwise documented below in the visit note. 

## 2015-01-03 ENCOUNTER — Ambulatory Visit: Payer: BC Managed Care – PPO | Admitting: Family Medicine

## 2015-04-01 ENCOUNTER — Other Ambulatory Visit: Payer: Self-pay | Admitting: *Deleted

## 2015-04-01 MED ORDER — CLONAZEPAM 1 MG PO TABS: ORAL_TABLET | ORAL | Status: DC

## 2015-04-01 NOTE — Telephone Encounter (Signed)
Rx faxed to [facility]. Pt advised and voiced understanding.

## 2015-04-01 NOTE — Telephone Encounter (Signed)
Pt called requesting refill for clonazepam.  LOV: 12/13/14 NOV: None Last written: 09/02/14 #60 w/ 5RF. Wants this faxed to [facility] and please call once this has been done.

## 2015-05-16 ENCOUNTER — Encounter: Payer: 59 | Admitting: Family Medicine
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
## 2015-10-14 ENCOUNTER — Other Ambulatory Visit: Payer: Self-pay | Admitting: *Deleted

## 2015-10-14 MED ORDER — CLONAZEPAM 1 MG PO TABS: ORAL_TABLET | ORAL | Status: DC

## 2015-10-14 NOTE — Telephone Encounter (Signed)
Left message for pt to call back  °

## 2015-10-14 NOTE — Telephone Encounter (Signed)
Rx printed for #60 with 5 RFs.  Needs o/v prior to any FURTHER rx's for this med---he has 6 months to come in for f/u.-thx

## 2015-10-14 NOTE — Telephone Encounter (Signed)
RF request for clonzaepam LOV: 12/13/14 Next ov: None - cancelled/No Showed last 2 apts Last written: 04/01/15 #60 w/ 5RF  Please advise. Thanks.

## 2015-10-17 NOTE — Telephone Encounter (Signed)
Left detailed message on cell vm, okay per DPR. Rx faxed.

## 2015-11-27 ENCOUNTER — Telehealth: Payer: Self-pay | Admitting: Family Medicine

## 2015-11-27 NOTE — Telephone Encounter (Signed)
Please call pt: - I received a message that he is "struggling" and "does not feel his medications are working.  - By chart review, it appears pt is on medications for anxiety only. - He has not been evaluated since last March. His PCP is Dr. Milinda Cave, who is out of the office until mid next week.  - If pt feels this is an emergency he should report the [facility] ED ([facility]/psych available) immediately. If not an emergency he can either wait until next week and schedule with his provider or he can placed in a 30 minute slot on my schedule IF he can not wait until his PCP returns. If he can wait, it would be more ideal for him to follow with his provider for this issue. If not I am happy to see him.

## 2015-11-27 NOTE — Telephone Encounter (Signed)
Patient is "struggling" right now & doesn't feel like his medicine is working. Please advise.

## 2015-11-27 NOTE — Telephone Encounter (Signed)
Dr. Milinda Cave pt.   Please advise. Thanks.

## 2015-11-27 NOTE — Telephone Encounter (Signed)
Left message for pt to call back. Left message for wife/Gina to call back, okay per DPR.

## 2015-11-28 ENCOUNTER — Ambulatory Visit (INDEPENDENT_AMBULATORY_CARE_PROVIDER_SITE_OTHER): Payer: 59 | Admitting: Family Medicine

## 2015-11-28 ENCOUNTER — Encounter: Payer: Self-pay | Admitting: Family Medicine

## 2015-11-28 DIAGNOSIS — F321 Major depressive disorder, single episode, moderate: Secondary | ICD-10-CM | POA: Diagnosis not present

## 2015-11-28 MED ORDER — PAROXETINE HCL 20 MG PO TABS: 20.0000 mg | ORAL_TABLET | Freq: Every day | ORAL | Status: DC

## 2015-11-28 NOTE — Patient Instructions (Signed)
It was a pleasure meeting you today. Stop Celexa and start Paxil tomorrow.  Continue klonopin  Followup in 4 weeks.  If need emergent [facility]  If you decide you want referral to psych just call, I will place.

## 2015-11-28 NOTE — Telephone Encounter (Signed)
Spoke to pt and his wife yesterday. He scheduled apt with Dr. Claiborne BillingsKuneff for today at 1:00pm.
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
## 2015-12-26 ENCOUNTER — Encounter: Payer: Self-pay | Admitting: Family Medicine

## 2015-12-26 ENCOUNTER — Ambulatory Visit (INDEPENDENT_AMBULATORY_CARE_PROVIDER_SITE_OTHER): Payer: 59 | Admitting: Family Medicine

## 2015-12-26 VITALS — BP 114/67 | HR 74 | Temp 98.5°F | Resp 16 | Ht 71.5 in | Wt 178.8 lb

## 2015-12-26 DIAGNOSIS — F321 Major depressive disorder, single episode, moderate: Secondary | ICD-10-CM

## 2015-12-26 DIAGNOSIS — F411 Generalized anxiety disorder: Secondary | ICD-10-CM | POA: Diagnosis not present

## 2015-12-26 MED ORDER — PAROXETINE HCL 20 MG PO TABS: 20.0000 mg | ORAL_TABLET | Freq: Every day | ORAL | Status: DC
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
## 2015-12-26 NOTE — Progress Notes (Signed)
Pre visit review using our clinic review tool, if applicable. No additional management support is needed unless otherwise documented below in the visit note. 

## 2016-01-01 ENCOUNTER — Other Ambulatory Visit: Payer: Self-pay | Admitting: *Deleted

## 2016-01-01 DIAGNOSIS — F321 Major depressive disorder, single episode, moderate: Secondary | ICD-10-CM

## 2016-01-01 MED ORDER — PAROXETINE HCL 20 MG PO TABS: 20.0000 mg | ORAL_TABLET | Freq: Every day | ORAL | Status: DC

## 2016-01-01 MED ORDER — CLONAZEPAM 1 MG PO TABS: ORAL_TABLET | ORAL | Status: DC

## 2016-01-01 NOTE — Telephone Encounter (Signed)
RF request for clonazepam - 90 day supply requested - [facility] LOV: 12/26/15 Next ov: 02/25/16 Last written: 10/14/15 #60 w/ 5RF  Please advise. Thanks.   RF request for paroxetine - Rx sent already.  Last written: 12/26/15 #30 w/ 3RF

## 2016-01-01 NOTE — Telephone Encounter (Signed)
Rx for clonazepam faxed to [facility].

## 2016-02-25 ENCOUNTER — Ambulatory Visit: Payer: 59 | Admitting: Family Medicine

## 2016-02-25 DIAGNOSIS — Z0289 Encounter for other administrative examinations: Secondary | ICD-10-CM

## 2016-04-19 ENCOUNTER — Other Ambulatory Visit: Payer: Self-pay | Admitting: *Deleted

## 2016-04-19 MED ORDER — CLONAZEPAM 1 MG PO TABS
ORAL_TABLET | ORAL | 1 refills | Status: DC
Start: 2016-04-19 — End: 2016-10-04

## 2016-04-19 NOTE — Telephone Encounter (Signed)
Rx faxed

## 2016-04-19 NOTE — Telephone Encounter (Signed)
RF request for clonazepam LOV: 12/26/15 Next ov: None Last written: 01/11/16 #180 w/ 1RF  Mail order refill request. Last refill has been shipped out. No refills on file for pharmacy.   Please advise. Thanks.

## 2016-06-15 ENCOUNTER — Telehealth: Payer: Self-pay

## 2016-06-15 NOTE — Telephone Encounter (Signed)
Fine. Pls call his pharmacy and cancel current paxil (paroxetine) rx. Pls do verbal for new paroxetine rx, 20mg  tabs, 1 tab po qd, #90, RF x 3.--thx

## 2016-06-15 NOTE — Telephone Encounter (Signed)
[facility], previous prescription canceled as directed, and patients wife notified

## 2016-06-15 NOTE — Telephone Encounter (Signed)
Patient got last prescription of Paxil filled through wife's mail in plan but medication is not the same size or color and patient doesn't think they are working the same.  He would like to have this prescription canceled and a new one called back into [facility]. Please advise.

## 2016-10-04 ENCOUNTER — Encounter: Payer: Self-pay | Admitting: Family Medicine

## 2016-10-04 ENCOUNTER — Ambulatory Visit (INDEPENDENT_AMBULATORY_CARE_PROVIDER_SITE_OTHER): Payer: 59 | Admitting: Family Medicine

## 2016-10-04 VITALS — BP 110/73 | HR 83 | Temp 98.1°F | Resp 16 | Wt 180.0 lb

## 2016-10-04 DIAGNOSIS — K2100 Gastro-esophageal reflux disease with esophagitis, without bleeding: Secondary | ICD-10-CM

## 2016-10-04 DIAGNOSIS — R070 Pain in throat: Secondary | ICD-10-CM | POA: Diagnosis not present

## 2016-10-04 DIAGNOSIS — F411 Generalized anxiety disorder: Secondary | ICD-10-CM | POA: Diagnosis not present

## 2016-10-04 MED ORDER — PANTOPRAZOLE SODIUM 40 MG PO TBEC: 40.0000 mg | DELAYED_RELEASE_TABLET | Freq: Every day | ORAL | 3 refills | Status: DC

## 2016-10-04 MED ORDER — CLONAZEPAM 1 MG PO TABS: ORAL_TABLET | ORAL | 1 refills | Status: DC
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
## 2016-11-01 ENCOUNTER — Ambulatory Visit: Payer: 59 | Admitting: Family Medicine

## 2017-02-28 ENCOUNTER — Ambulatory Visit (INDEPENDENT_AMBULATORY_CARE_PROVIDER_SITE_OTHER): Payer: 59 | Admitting: Family Medicine

## 2017-02-28 ENCOUNTER — Encounter: Payer: Self-pay | Admitting: Family Medicine

## 2017-02-28 VITALS — BP 111/71 | HR 78 | Temp 98.3°F | Resp 16 | Ht 71.5 in | Wt 197.8 lb

## 2017-02-28 DIAGNOSIS — F411 Generalized anxiety disorder: Secondary | ICD-10-CM

## 2017-02-28 DIAGNOSIS — F331 Major depressive disorder, recurrent, moderate: Secondary | ICD-10-CM

## 2017-02-28 MED ORDER — CLONAZEPAM 1 MG PO TABS: ORAL_TABLET | ORAL | 1 refills | Status: DC

## 2017-02-28 MED ORDER — PAROXETINE HCL 40 MG PO TABS: ORAL_TABLET | ORAL | 0 refills | Status: DC
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
## 2017-04-05 ENCOUNTER — Encounter: Payer: Self-pay | Admitting: Family Medicine

## 2017-04-05 ENCOUNTER — Ambulatory Visit (INDEPENDENT_AMBULATORY_CARE_PROVIDER_SITE_OTHER): Payer: 59 | Admitting: Family Medicine

## 2017-04-05 VITALS — BP 131/66 | HR 75 | Temp 98.3°F | Resp 16 | Ht 71.5 in | Wt 197.5 lb

## 2017-04-05 DIAGNOSIS — F331 Major depressive disorder, recurrent, moderate: Secondary | ICD-10-CM | POA: Diagnosis not present

## 2017-04-05 DIAGNOSIS — F411 Generalized anxiety disorder: Secondary | ICD-10-CM

## 2017-04-05 MED ORDER — PAROXETINE HCL 40 MG PO TABS: ORAL_TABLET | ORAL | 11 refills | Status: DC

## 2017-04-05 MED ORDER — BUPROPION HCL ER (XL) 150 MG PO TB24: 150.0000 mg | ORAL_TABLET | Freq: Every day | ORAL | 1 refills | Status: DC
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
## 2017-04-15 ENCOUNTER — Telehealth: Payer: Self-pay | Admitting: *Deleted

## 2017-04-15 MED ORDER — ARIPIPRAZOLE 5 MG PO TABS: 5.0000 mg | ORAL_TABLET | Freq: Every day | ORAL | 0 refills | Status: DC

## 2017-04-15 NOTE — Telephone Encounter (Signed)
OK, d/c wellbutrin. I'll send in rx for abilify. Keep appt with me already set for 05/06/17.-thx

## 2017-04-15 NOTE — Telephone Encounter (Signed)
Pts wife advised and voiced understanding, okay per DPR. 

## 2017-04-15 NOTE — Telephone Encounter (Signed)
Left message for pts wife to call back, okay per DPR. 

## 2017-04-15 NOTE — Telephone Encounter (Signed)
Pts wife called per pts request to advise that the Wellbutrin is not helping. She stated that pt told her it is making him irritable. She stated that pt wants to know if Dr. Milinda CaveMcGowen will change this to the Abilify. Please advise. Thanks.

## 2017-05-06 ENCOUNTER — Encounter: Payer: Self-pay | Admitting: Family Medicine

## 2017-05-06 ENCOUNTER — Ambulatory Visit (INDEPENDENT_AMBULATORY_CARE_PROVIDER_SITE_OTHER): Payer: 59 | Admitting: Family Medicine

## 2017-05-06 VITALS — BP 126/66 | HR 76 | Temp 98.4°F | Resp 16 | Ht 71.5 in | Wt 205.5 lb

## 2017-05-06 DIAGNOSIS — F331 Major depressive disorder, recurrent, moderate: Secondary | ICD-10-CM | POA: Diagnosis not present

## 2017-05-06 MED ORDER — ARIPIPRAZOLE 5 MG PO TABS
5.0000 mg | ORAL_TABLET | Freq: Every day | ORAL | 6 refills | Status: DC
Start: 2017-05-06 — End: 2017-11-28
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
## 2017-05-28 DIAGNOSIS — E781 Pure hyperglyceridemia: Secondary | ICD-10-CM

## 2017-05-28 HISTORY — DX: Pure hyperglyceridemia: E78.1

## 2017-05-31 ENCOUNTER — Encounter: Payer: Self-pay | Admitting: Family Medicine

## 2017-05-31 ENCOUNTER — Ambulatory Visit (INDEPENDENT_AMBULATORY_CARE_PROVIDER_SITE_OTHER): Payer: 59 | Admitting: Family Medicine

## 2017-05-31 VITALS — BP 106/75 | HR 89 | Temp 98.3°F | Resp 16 | Ht 71.5 in | Wt 202.2 lb

## 2017-05-31 DIAGNOSIS — F3341 Major depressive disorder, recurrent, in partial remission: Secondary | ICD-10-CM

## 2017-05-31 DIAGNOSIS — Z23 Encounter for immunization: Secondary | ICD-10-CM

## 2017-05-31 DIAGNOSIS — Z0000 Encounter for general adult medical examination without abnormal findings: Secondary | ICD-10-CM | POA: Diagnosis not present

## 2017-05-31 LAB — COMPREHENSIVE METABOLIC PANEL
ALT: 13 U/L (ref 0–53)
AST: 19 U/L (ref 0–37)
Albumin: 4.5 g/dL (ref 3.5–5.2)
Alkaline Phosphatase: 78 U/L (ref 39–117)
BILIRUBIN TOTAL: 0.7 mg/dL (ref 0.2–1.2)
BUN: 11 mg/dL (ref 6–23)
CALCIUM: 9.7 mg/dL (ref 8.4–10.5)
CHLORIDE: 104 meq/L (ref 96–112)
CO2: 27 meq/L (ref 19–32)
Creatinine, Ser: 0.98 mg/dL (ref 0.40–1.50)
GFR: 87.11 mL/min (ref >60.00)
Glucose, Bld: 102 mg/dL — ABNORMAL HIGH (ref 70–99)
Potassium: 4.6 mEq/L (ref 3.5–5.1)
Sodium: 140 mEq/L (ref 135–145)
Total Protein: 6.9 g/dL (ref 6.0–8.3)

## 2017-05-31 LAB — LIPID PANEL
CHOL/HDL RATIO: 5
Cholesterol: 234 mg/dL — ABNORMAL HIGH (ref 0–200)
HDL: 43.7 mg/dL (ref >39.00)
NonHDL: 190.17
Triglycerides: 283 mg/dL — ABNORMAL HIGH (ref 0.0–149.0)
VLDL: 56.6 mg/dL — AB (ref 0.0–40.0)

## 2017-05-31 LAB — LDL CHOLESTEROL, DIRECT: LDL DIRECT: 127 mg/dL

## 2017-05-31 LAB — CBC WITH DIFFERENTIAL/PLATELET
BASOS PCT: 1.3 % (ref 0.0–3.0)
Basophils Absolute: 0.1 10*3/uL (ref 0.0–0.1)
Eosinophils Absolute: 0.2 10*3/uL (ref 0.0–0.7)
Eosinophils Relative: 3 % (ref 0.0–5.0)
HEMATOCRIT: 51.6 % (ref 39.0–52.0)
Hemoglobin: 17.1 g/dL — ABNORMAL HIGH (ref 13.0–17.0)
LYMPHS ABS: 1.9 10*3/uL (ref 0.7–4.0)
LYMPHS PCT: 33.7 % (ref 12.0–46.0)
MCHC: 33.2 g/dL (ref 30.0–36.0)
MCV: 96.2 fl (ref 78.0–100.0)
MONOS PCT: 9.3 % (ref 3.0–12.0)
Monocytes Absolute: 0.5 10*3/uL (ref 0.1–1.0)
NEUTROS ABS: 3 10*3/uL (ref 1.4–7.7)
Neutrophils Relative %: 52.7 % (ref 43.0–77.0)
PLATELETS: 214 10*3/uL (ref 150.0–400.0)
RBC: 5.36 Mil/uL (ref 4.22–5.81)
RDW: 13.6 % (ref 11.5–15.5)
WBC: 5.7 10*3/uL (ref 4.0–10.5)

## 2017-05-31 LAB — TSH: TSH: 1.59 u[IU]/mL (ref 0.35–4.50)

## 2017-05-31 NOTE — Addendum Note (Signed)
Addended by: Smitty KnudsenSUTHERLAND, Abubakar Crispo K on: 05/31/2017 09:19 AM   Modules accepted: Orders
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
## 2017-05-31 NOTE — Patient Instructions (Signed)

## 2017-06-01 ENCOUNTER — Encounter: Payer: Self-pay | Admitting: *Deleted

## 2017-06-09 ENCOUNTER — Other Ambulatory Visit: Payer: Self-pay | Admitting: Family Medicine

## 2017-06-30 ENCOUNTER — Other Ambulatory Visit: Payer: Self-pay | Admitting: *Deleted

## 2017-06-30 NOTE — Telephone Encounter (Signed)
Opened in error

## 2017-11-25 DIAGNOSIS — G4733 Obstructive sleep apnea (adult) (pediatric): Secondary | ICD-10-CM

## 2017-11-25 HISTORY — DX: Obstructive sleep apnea (adult) (pediatric): G47.33

## 2017-11-28 ENCOUNTER — Encounter: Payer: Self-pay | Admitting: Family Medicine

## 2017-11-28 ENCOUNTER — Ambulatory Visit (INDEPENDENT_AMBULATORY_CARE_PROVIDER_SITE_OTHER): Payer: 59 | Admitting: Family Medicine

## 2017-11-28 VITALS — BP 109/68 | HR 76 | Temp 98.6°F | Resp 16 | Ht 71.5 in | Wt 195.5 lb

## 2017-11-28 DIAGNOSIS — G4733 Obstructive sleep apnea (adult) (pediatric): Secondary | ICD-10-CM

## 2017-11-28 DIAGNOSIS — F411 Generalized anxiety disorder: Secondary | ICD-10-CM | POA: Diagnosis not present

## 2017-11-28 DIAGNOSIS — R400 Somnolence: Secondary | ICD-10-CM | POA: Diagnosis not present

## 2017-11-28 DIAGNOSIS — F3342 Major depressive disorder, recurrent, in full remission: Secondary | ICD-10-CM

## 2017-11-28 MED ORDER — CLONAZEPAM 1 MG PO TABS: ORAL_TABLET | ORAL | 1 refills | Status: DC

## 2017-11-28 MED ORDER — ARIPIPRAZOLE 5 MG PO TABS: 5.0000 mg | ORAL_TABLET | Freq: Every day | ORAL | 1 refills | Status: DC

## 2017-11-28 MED ORDER — PANTOPRAZOLE SODIUM 40 MG PO TBEC: 40.0000 mg | DELAYED_RELEASE_TABLET | Freq: Every day | ORAL | 3 refills | Status: DC
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
## 2017-12-15 ENCOUNTER — Encounter: Payer: Self-pay | Admitting: Neurology

## 2017-12-15 ENCOUNTER — Ambulatory Visit (INDEPENDENT_AMBULATORY_CARE_PROVIDER_SITE_OTHER): Payer: 59 | Admitting: Neurology

## 2017-12-15 VITALS — BP 134/78 | HR 75 | Ht 71.5 in | Wt 196.0 lb

## 2017-12-15 DIAGNOSIS — E663 Overweight: Secondary | ICD-10-CM | POA: Diagnosis not present

## 2017-12-15 DIAGNOSIS — R351 Nocturia: Secondary | ICD-10-CM

## 2017-12-15 DIAGNOSIS — R51 Headache: Secondary | ICD-10-CM

## 2017-12-15 DIAGNOSIS — R0681 Apnea, not elsewhere classified: Secondary | ICD-10-CM

## 2017-12-15 DIAGNOSIS — R0683 Snoring: Secondary | ICD-10-CM | POA: Diagnosis not present

## 2017-12-15 DIAGNOSIS — F39 Unspecified mood [affective] disorder: Secondary | ICD-10-CM | POA: Diagnosis not present

## 2017-12-15 DIAGNOSIS — G4719 Other hypersomnia: Secondary | ICD-10-CM

## 2017-12-15 DIAGNOSIS — R519 Headache, unspecified: Secondary | ICD-10-CM

## 2017-12-15 NOTE — Patient Instructions (Addendum)
Thank you for choosing [facility] for your sleep related care! It was nice to meet you today! I appreciate that you entrust me with your sleep related healthcare concerns. I hope, I was able to address at least some of your concerns today, and that I can help you feel reassured and also get better.    Here is what we discussed today and what we came up with as our plan for you:    Based on your symptoms and your exam I believe you are at risk for obstructive sleep apnea or OSA, and I think we should proceed with a sleep study to determine whether you do or do not have OSA and how severe it is. If you have more than mild OSA, I want you to consider treatment with CPAP. Please remember, the risks and ramifications of moderate to severe obstructive sleep apnea or OSA are: Cardiovascular disease, including congestive heart failure, stroke, difficult to control hypertension, arrhythmias, and even type 2 diabetes has been linked to untreated OSA. Sleep apnea causes disruption of sleep and sleep deprivation in most cases, which, in turn, can cause recurrent headaches, problems with memory, mood, concentration, focus, and vigilance. Most people with untreated sleep apnea report excessive daytime sleepiness, which can affect their ability to drive. Please do not drive if you feel sleepy.   I will likely see you back after your sleep study to go over the test results and where to go from there. We will call you after your sleep study to advise about the results (most likely, you will hear from SelmerKristen, my nurse) and to set up an appointment at the time, as necessary.    Our sleep lab administrative assistant will call you to schedule your sleep study. If you don't hear back from her by about 2 weeks from now, please feel free to call her at [phone]. You can leave a message with your phone number and concerns, if you get the voicemail box. She will call back as soon as possible.   Next time  you have blood work, please have your PCP check vit. D and testosterone level.
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
## 2017-12-18 ENCOUNTER — Encounter: Payer: Self-pay | Admitting: Family Medicine

## 2017-12-19 ENCOUNTER — Telehealth: Payer: Self-pay

## 2017-12-19 DIAGNOSIS — G4719 Other hypersomnia: Secondary | ICD-10-CM

## 2017-12-19 NOTE — Telephone Encounter (Signed)
HST order placed. 

## 2017-12-19 NOTE — Telephone Encounter (Signed)
[employer] denied in lab sleep study, need HST order 

## 2017-12-26 HISTORY — PX: OTHER SURGICAL HISTORY: SHX169

## 2018-01-16 ENCOUNTER — Ambulatory Visit (INDEPENDENT_AMBULATORY_CARE_PROVIDER_SITE_OTHER): Payer: 59 | Admitting: Neurology

## 2018-01-16 DIAGNOSIS — G4733 Obstructive sleep apnea (adult) (pediatric): Secondary | ICD-10-CM | POA: Diagnosis not present

## 2018-01-16 DIAGNOSIS — G4734 Idiopathic sleep related nonobstructive alveolar hypoventilation: Secondary | ICD-10-CM

## 2018-01-16 DIAGNOSIS — G4719 Other hypersomnia: Secondary | ICD-10-CM

## 2018-01-23 ENCOUNTER — Telehealth: Payer: Self-pay

## 2018-01-23 NOTE — Progress Notes (Signed)
Patient referred by Dr. Milinda Cave, seen by me on 12/15/17, HST 01/16/18.    Please call and notify the patient that the recent home sleep test showed obstructive sleep apnea in the severe range. While I recommend treatment for this in the form CPAP, his insurance will not approve a sleep study for this. They will likely only approve a trial of autoPAP, which means, that we don't have to bring him in for a sleep study with CPAP, but will let him try an autoPAP machine at home, through a DME company (of his choice, or as per insurance requirement). The DME representative will educate him on how to use the machine, how to put the mask on, etc. I have placed an order in the chart. Please send referral, talk to patient, send report to referring MD. We will need a FU in sleep clinic for 10 weeks post-PAP set up, please arrange that with me or one of our NPs. Thanks,   Huston Foley, MD, PhD [facility])

## 2018-01-23 NOTE — Addendum Note (Signed)
Addended by: Huston Foley on: 01/23/2018 08:33 AM   Modules accepted: Orders

## 2018-01-23 NOTE — Telephone Encounter (Signed)
Pt returned my call. I advised pt that Dr. Frances Furbish reviewed their sleep study results and found that pt has severe osa. Dr. Frances Furbish recommends that pt start an auto pap at home, because pt's insurance will not approve an in lab cpap titration study. I reviewed PAP compliance expectations with the pt. Pt is agreeable to starting an auto-PAP. I advised pt that an order will be sent to a DME, [facility], and [facility] will call the pt within about one week after they file with the pt's insurance. [facility] will show the pt how to use the machine, fit for masks, and troubleshoot the auto-PAP if needed. A follow up appt was made for insurance purposes with Dr. Frances Furbish on 04/27/18 at 8:30am. Pt verbalized understanding to arrive 15 minutes early and bring their auto-PAP. A letter with all of this information in it will be sent to the pt's mychart as a reminder. I verified with the pt that the address we have on file is correct. Pt verbalized understanding of results. Pt had no questions at this time but was encouraged to call back if questions arise.

## 2018-01-23 NOTE — Telephone Encounter (Signed)
-----   Message from Huston Foley, MD sent at 01/23/2018  8:33 AM EDT ----- Patient referred by Dr. Milinda Cave, seen by me on 12/15/17, HST 01/16/18.    Please call and notify the patient that the recent home sleep test showed obstructive sleep apnea in the severe range. While I recommend treatment for this in the form CPAP, his insurance will not approve a sleep study for this. They will likely only approve a trial of autoPAP, which means, that we don't have to bring him in for a sleep study with CPAP, but will let him try an autoPAP machine at home, through a DME company (of his choice, or as per insurance requirement). The DME representative will educate him on how to use the machine, how to put the mask on, etc. I have placed an order in the chart. Please send referral, talk to patient, send report to referring MD. We will need a FU in sleep clinic for 10 weeks post-PAP set up, please arrange that with me or one of our NPs. Thanks,   Huston Foley, MD, PhD [facility])

## 2018-01-23 NOTE — Telephone Encounter (Signed)
I called pt to discuss his sleep study results. No answer, left a message asking him to call me back. 

## 2018-01-23 NOTE — Procedures (Signed)
[facility] [address] [place], [place] [address] NAME:  Tyrone Page                                                                  DOB: Mar 06, 1971 MEDICAL RECORD NUMBER                                                            DOS: 01/16/2018 REFERRING PHYSICIAN: Earley Favor STUDY PERFORMED: Home Sleep Study HISTORY: 47 year old man with a history of hypertriglyceridemia, overweight state, depression, anxiety, and prior history of smoking, who reports snoring and excessive daytime somnolence. His Epworth sleepiness score is 9 out of 24, BMI of 27.3.   STUDY RESULTS:  Total Recording Time: 8 hours,  46 minutes (flow evaluation: 2 h, 30 min) Total Apnea/Hypopnea Index (AHI):   65.5/h, RDI: 67.8/h Average Oxygen Saturation:    90%, Lowest Oxygen Desaturation: 81%  Total Time Oxygen Saturation Below or at 88 %: 125.0 minutes (24%)  Average Heart Rate: 68 bpm (between 49 and 106 bpm) IMPRESSION: OSA; Nocturnal hypoxemia RECOMMENDATION: This home sleep test demonstrates severe obstructive sleep apnea with a total AHI of 65.5/hour and O2 nadir of 81% with significant time below 89% saturation. Given the patient's medical history and sleep related complaints, treatment with positive airway pressure is recommended. This can be achieved in the form of autoPAP trial/titration at home. A full night CPAP titration study will help with proper treatment settings and mask fitting if needed, but given his insurance provider, a CPAP titration study will not be approved. Please note that untreated obstructive sleep apnea carries additional perioperative morbidity. Patients with significant obstructive sleep apnea should receive perioperative PAP therapy and the surgeons and particularly the anesthesiologist should be informed of the diagnosis and the severity of the sleep disordered breathing. The patient should be cautioned not to drive, work at heights, or operate dangerous or  heavy equipment when tired or sleepy. Review and reiteration of good sleep hygiene measures should be pursued with any patient. Other causes of the patient's symptoms, including circadian rhythm disturbances, an underlying mood disorder, medication effect and/or an underlying medical problem cannot be ruled out based on this test. Clinical correlation is recommended. The patient and his referring provider will be notified of the test results. The patient will be seen in follow up in sleep clinic at [facility].  I certify that I have reviewed the raw data recording prior to the issuance of this report in accordance with the standards of the American Academy of Sleep Medicine (AASM).  Huston Foley, MD, PhD [facility]) Diplomat, ABPN (Neurology and Sleep)

## 2018-01-26 ENCOUNTER — Encounter: Payer: Self-pay | Admitting: Family Medicine

## 2018-02-08 NOTE — Telephone Encounter (Signed)
Patient wants to know if he can buy his own CPAP anywhere instead of going through [facility] [employer] since insurance will not pay.

## 2018-02-08 NOTE — Telephone Encounter (Signed)
I called pt to discuss. No answer, left a message asking him to call me back. 

## 2018-02-09 NOTE — Telephone Encounter (Signed)
I called pt again to discuss. No answer, left a message asking him to call me back. 

## 2018-02-09 NOTE — Telephone Encounter (Signed)
Pt returned my call. He reports that insurance will not cover his cpap but that [facility] gave him information on a mini-cpap. I recommended that pt purchase a cpap through a local DME so that he can receive regular supplies and maintenance to his machine. Pt is willing to do this but wants to know if the "mini-cpap" is an option as well. I advised pt that I will speak with [facility] and Dr. Frances Furbish to find out more information on this. Pt verbalized understanding.

## 2018-02-10 NOTE — Telephone Encounter (Signed)
Spoke to [employer] and Dr. Frances Furbish. Dr. Frances Furbish recommends that pt continue with the prescribed normal size cpap. Travel and mini cpaps are not covered all through insurance. I spoke with [facility]. His cpap is covered 80/20 through insurance.

## 2018-02-10 NOTE — Telephone Encounter (Signed)
I called pt to discuss. No answer, left a message asking him to call me back. 

## 2018-02-10 NOTE — Telephone Encounter (Signed)
I called pt and explained that he should proceed with cpap. Pt is agreeable to this and will be returning [employer]'s calls. I asked him to call me back with further questions or concerns. Pt verbalized understanding.

## 2018-02-23 DIAGNOSIS — G4733 Obstructive sleep apnea (adult) (pediatric): Secondary | ICD-10-CM | POA: Diagnosis not present

## 2018-03-26 DIAGNOSIS — G4733 Obstructive sleep apnea (adult) (pediatric): Secondary | ICD-10-CM | POA: Diagnosis not present

## 2018-03-27 DIAGNOSIS — S82402A Unspecified fracture of shaft of left fibula, initial encounter for closed fracture: Secondary | ICD-10-CM

## 2018-03-27 HISTORY — DX: Unspecified fracture of shaft of left fibula, initial encounter for closed fracture: S82.402A

## 2018-04-10 ENCOUNTER — Telehealth: Payer: Self-pay

## 2018-04-10 NOTE — Telephone Encounter (Signed)
Dr. Frances FurbishAthar will not be in the office for pt's appt on 04/27/18. I called pt to discuss rescheduling his appt. No answer, left a message asking him to call me back. If pt calls back, please explain this to him. Pt may have an 8:00am appt on Wed or Thurs of that week, if he is able to make that appt.

## 2018-04-12 ENCOUNTER — Other Ambulatory Visit: Payer: Self-pay | Admitting: Family Medicine

## 2018-04-13 ENCOUNTER — Emergency Department (HOSPITAL_COMMUNITY)
Admission: EM | Admit: 2018-04-13 | Discharge: 2018-04-13 | Disposition: A | Discharge status: Home / Self Care | Payer: 59 | Attending: Emergency Medicine | Admitting: Emergency Medicine

## 2018-04-13 ENCOUNTER — Emergency Department (HOSPITAL_COMMUNITY): Payer: 59

## 2018-04-13 ENCOUNTER — Other Ambulatory Visit: Payer: Self-pay

## 2018-04-13 ENCOUNTER — Encounter (HOSPITAL_COMMUNITY): Payer: Self-pay | Admitting: Emergency Medicine

## 2018-04-13 ENCOUNTER — Encounter: Payer: Self-pay | Admitting: Neurology

## 2018-04-13 DIAGNOSIS — Y999 Unspecified external cause status: Secondary | ICD-10-CM | POA: Diagnosis not present

## 2018-04-13 DIAGNOSIS — S8252XA Displaced fracture of medial malleolus of left tibia, initial encounter for closed fracture: Secondary | ICD-10-CM | POA: Diagnosis not present

## 2018-04-13 DIAGNOSIS — Z79899 Other long term (current) drug therapy: Secondary | ICD-10-CM | POA: Diagnosis not present

## 2018-04-13 DIAGNOSIS — S82832A Other fracture of upper and lower end of left fibula, initial encounter for closed fracture: Secondary | ICD-10-CM

## 2018-04-13 DIAGNOSIS — Y939 Activity, unspecified: Secondary | ICD-10-CM | POA: Insufficient documentation

## 2018-04-13 DIAGNOSIS — Y929 Unspecified place or not applicable: Secondary | ICD-10-CM | POA: Insufficient documentation

## 2018-04-13 DIAGNOSIS — S99912A Unspecified injury of left ankle, initial encounter: Secondary | ICD-10-CM | POA: Diagnosis present

## 2018-04-13 DIAGNOSIS — W11XXXA Fall on and from ladder, initial encounter: Secondary | ICD-10-CM | POA: Insufficient documentation

## 2018-04-13 DIAGNOSIS — S8262XA Displaced fracture of lateral malleolus of left fibula, initial encounter for closed fracture: Secondary | ICD-10-CM | POA: Diagnosis not present

## 2018-04-13 DIAGNOSIS — Z87891 Personal history of nicotine dependence: Secondary | ICD-10-CM | POA: Insufficient documentation

## 2018-04-13 MED ORDER — OXYCODONE-ACETAMINOPHEN 5-325 MG PO TABS
1.0000 | ORAL_TABLET | Freq: Once | ORAL | Status: AC
  Administered 2018-04-13: 1 via ORAL

## 2018-04-13 MED ORDER — OXYCODONE-ACETAMINOPHEN 5-325 MG PO TABS
ORAL_TABLET | ORAL | Status: AC
  Filled 2018-04-13: qty 1

## 2018-04-13 MED ORDER — IBUPROFEN 800 MG PO TABS: 800.0000 mg | ORAL_TABLET | Freq: Three times a day (TID) | ORAL | 0 refills | Status: DC

## 2018-04-13 MED ORDER — OXYCODONE-ACETAMINOPHEN 5-325 MG PO TABS
1.0000 | ORAL_TABLET | Freq: Once | ORAL | Status: AC
  Administered 2018-04-13: 1 via ORAL
  Filled 2018-04-13: qty 1

## 2018-04-13 MED ORDER — HYDROCODONE-ACETAMINOPHEN 5-325 MG PO TABS: 1.0000 | ORAL_TABLET | Freq: Four times a day (QID) | ORAL | 0 refills | Status: DC | PRN

## 2018-04-13 NOTE — ED Notes (Signed)
Ortho at bedside.

## 2018-04-13 NOTE — ED Notes (Signed)
Ortho paged. 

## 2018-04-13 NOTE — ED Provider Notes (Signed)
Patient placed in Quick Look pathway, seen and evaluated   Chief Complaint: ankle injury  HPI:   Tyrone Page is a 47 y.o. male who presents to the ED with left ankle pain and swelling. The patient reports that he fell from a ladder about 1 hour prior to arrival to the ED. He has been unable to bear weight on the ankle since the injury. Patient reports that at the time of the injury he felt a pop followed by severe pain.   ROS: M/S: ankle pain and swelling   Neuro: tingling in foot Physical Exam:  BP (!) 116/93 (BP Location: Right Arm)   Pulse 78   Temp 98.5 F (36.9 C) (Oral)   Resp 16   SpO2 96%    Gen: No distress  Neuro: Awake and Alert  Skin: Warm and dry  M/S: left ankle with swelling bilateral, pedal pulse palpated. Unable to do range of motion due to pain and swelling.       Initiation of care has begun. The patient has been counseled on the process, plan, and necessity for staying for the completion/evaluation, and the remainder of the medical screening examination    Janne Napoleoneese, Hope M, NP 04/13/18 1623    Eber HongMiller, Brian, MD 04/18/18 1134
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
## 2018-04-13 NOTE — Discharge Instructions (Signed)
Take ibuprofen 3 times a day with meals.  Do not take other anti-inflammatories at the same time open (Advil, Motrin, naproxen, Aleve).  Take Norco as needed for severe or breakthrough pain.  Have caution while taking this medicine.  Do not drive or operate heavy machinery. Use the crutches to remain nonweightbearing until you follow-up with the orthopedic doctor next week. Keep your leg elevated when able. Call the orthopedic doctor to make an appointment for next week. Return to the emergency room if you develop severe worsening pain, numbness, color change of the tips of your toes, or any new or concerning symptoms.

## 2018-04-13 NOTE — Progress Notes (Signed)
Orthopedic Tech Progress Note Patient Details:  Tyrone Page 1971-03-27 [id number]  Ortho Devices Type of Ortho Device: Ace wrap, Short leg splint, Stirrup splint, Crutches Ortho Device/Splint Interventions: Application   Post Interventions Patient Tolerated: Well, Ambulated well Instructions Provided: Poper ambulation with device, Care of device, Adjustment of device   Tyrone Page 04/13/2018, 7:56 PM

## 2018-04-13 NOTE — ED Notes (Signed)
Pyxis machine not working, witnessed override of Microbiologistpercocet by Dean Foods CompanyCallie, RCharity fundraiser

## 2018-04-13 NOTE — ED Triage Notes (Signed)
Pt c/o ankle injury after falling from a ladder today. Ankle swollen, reports mild numbness to foot.

## 2018-04-17 DIAGNOSIS — S82852A Displaced trimalleolar fracture of left lower leg, initial encounter for closed fracture: Secondary | ICD-10-CM | POA: Diagnosis not present

## 2018-04-18 DIAGNOSIS — G8918 Other acute postprocedural pain: Secondary | ICD-10-CM | POA: Diagnosis not present

## 2018-04-18 DIAGNOSIS — S82852A Displaced trimalleolar fracture of left lower leg, initial encounter for closed fracture: Secondary | ICD-10-CM | POA: Diagnosis not present

## 2018-04-18 DIAGNOSIS — S93432A Sprain of tibiofibular ligament of left ankle, initial encounter: Secondary | ICD-10-CM | POA: Diagnosis not present

## 2018-04-18 DIAGNOSIS — Z472 Encounter for removal of internal fixation device: Secondary | ICD-10-CM | POA: Diagnosis not present

## 2018-04-25 DIAGNOSIS — G4733 Obstructive sleep apnea (adult) (pediatric): Secondary | ICD-10-CM | POA: Diagnosis not present

## 2018-04-27 ENCOUNTER — Ambulatory Visit: Payer: Self-pay | Admitting: Neurology

## 2018-04-28 ENCOUNTER — Telehealth: Payer: Self-pay | Admitting: Family Medicine

## 2018-04-28 NOTE — Telephone Encounter (Signed)
Copied from CRM [phone]. Topic: General - Other >> Apr 28, 2018  2:45 PM Leafy Roobinson, Norma J wrote: Reason for CRM: [facility] is calling to see if dr Milinda Cavemcgowen still would like  to prescribe clonazepam. Pt is taking hydrocodone and oxycodone prescribed by  another doctors. The patient could experience other sides effect.  Please call [phone] reference number [id number]

## 2018-05-01 DIAGNOSIS — S82852D Displaced trimalleolar fracture of left lower leg, subsequent encounter for closed fracture with routine healing: Secondary | ICD-10-CM | POA: Diagnosis not present

## 2018-05-01 NOTE — Telephone Encounter (Signed)
Spoke with patient, he stated that he broke his Left ankle and had surgery today. Orthopaedic has prescribed Oxycodone and Ibuprofen.

## 2018-05-01 NOTE — Telephone Encounter (Signed)
Yes, pls notify [facility] that I DO still want to rx clonazepam.-thx

## 2018-05-01 NOTE — Telephone Encounter (Signed)
[facility] calling does Dr Milinda CaveMcGowen still want pharmacy to fill clonazepam 1mg ? Please advise CB # [phone] REF # [id number]

## 2018-05-01 NOTE — Telephone Encounter (Signed)
[facility] notified.

## 2018-05-01 NOTE — Telephone Encounter (Signed)
Please advise. Thanks.  

## 2018-05-01 NOTE — Telephone Encounter (Signed)
Pls contact pt and see what you can figure out about the accuracy of this information (regarding other doctor's rx'ing him hydrocodone/oxycodone).-thx

## 2018-05-16 ENCOUNTER — Encounter: Payer: Self-pay | Admitting: Neurology

## 2018-05-18 ENCOUNTER — Encounter: Payer: Self-pay | Admitting: Neurology

## 2018-05-18 ENCOUNTER — Ambulatory Visit (INDEPENDENT_AMBULATORY_CARE_PROVIDER_SITE_OTHER): Payer: 59 | Admitting: Neurology

## 2018-05-18 VITALS — BP 108/64 | HR 78 | Ht 71.0 in | Wt 191.0 lb

## 2018-05-18 DIAGNOSIS — Z9989 Dependence on other enabling machines and devices: Secondary | ICD-10-CM

## 2018-05-18 DIAGNOSIS — Z789 Other specified health status: Secondary | ICD-10-CM

## 2018-05-18 DIAGNOSIS — G4733 Obstructive sleep apnea (adult) (pediatric): Secondary | ICD-10-CM

## 2018-05-18 NOTE — Progress Notes (Signed)
Order for ONO sent to [facility]. 

## 2018-05-18 NOTE — Patient Instructions (Addendum)
Please continue using your autoPAP regularly. While your insurance requires that you use PAP at least 4 hours each night on 70% of the nights, I recommend, that you not skip any nights and use it throughout the night if you can. Getting used to PAP and staying with the treatment long term does take time and patience and discipline. Untreated obstructive sleep apnea when it is moderate to severe can have an adverse impact on cardiovascular health and raise her risk for heart disease, arrhythmias, hypertension, congestive heart failure, stroke and diabetes. Untreated obstructive sleep apnea causes sleep disruption, nonrestorative sleep, and sleep deprivation. This can have an impact on your day to day functioning and cause daytime sleepiness and impairment of cognitive function, memory loss, mood disturbance, and problems focussing. Using PAP regularly can improve these symptoms.  As discussed, we will do an overnight oxygen level test, called ONO, and your DME company will call and set this up for one night, while you also use your autoPAP as usual. We will call you with the results. This is to make sure that your oxygen levels stay in the 90s, while you are treated with autoPAP for your OSA. Remember, your oxygen levels dropped into the 80s during the home sleep test. If you don't hear from us or [facility] about this test or the result of it, please call or email.   We will do a 3 mo follow up. Please call in 4-6 weeks for an update on your symptoms and so I can print another "download"
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
## 2018-05-21 DIAGNOSIS — J449 Chronic obstructive pulmonary disease, unspecified: Secondary | ICD-10-CM | POA: Diagnosis not present

## 2018-05-21 DIAGNOSIS — R0902 Hypoxemia: Secondary | ICD-10-CM | POA: Diagnosis not present

## 2018-05-22 ENCOUNTER — Encounter: Payer: Self-pay | Admitting: Neurology

## 2018-05-26 DIAGNOSIS — G4733 Obstructive sleep apnea (adult) (pediatric): Secondary | ICD-10-CM | POA: Diagnosis not present

## 2018-06-01 DIAGNOSIS — S82852D Displaced trimalleolar fracture of left lower leg, subsequent encounter for closed fracture with routine healing: Secondary | ICD-10-CM | POA: Diagnosis not present

## 2018-06-05 ENCOUNTER — Encounter: Payer: 59 | Admitting: Family Medicine

## 2018-06-05 ENCOUNTER — Telehealth: Payer: Self-pay | Admitting: Neurology

## 2018-06-05 DIAGNOSIS — Z9989 Dependence on other enabling machines and devices: Secondary | ICD-10-CM

## 2018-06-05 DIAGNOSIS — G4734 Idiopathic sleep related nonobstructive alveolar hypoventilation: Secondary | ICD-10-CM

## 2018-06-05 DIAGNOSIS — G4733 Obstructive sleep apnea (adult) (pediatric): Secondary | ICD-10-CM

## 2018-06-05 NOTE — Telephone Encounter (Signed)
I reviewed patient's pulse oximetry test result from 05/21/2018, with a total testing time of 7 hours and 46 minutes, patient was using his AutoPap while on room air. Average oxygen saturation was 90%, nadir was 80% with time below or at 88% saturation of 51 minutes, indicating ongoing desaturations while on AutoPap therapy. Patient has been compliant with AutoPap therapy but would benefit from a full night titration study which I will order and we will submit for insurance authorization to optimize his treatment settings. Please notify patient that we will request an actual lab attended sleep study at this point if he is agreeable.

## 2018-06-06 NOTE — Telephone Encounter (Signed)
I called pt to discuss his ONO results. No answer, left a message asking him to call me back. 

## 2018-06-07 NOTE — Telephone Encounter (Signed)
Pt called me back. I explained his ONO results. He is agreeable to a cpap titration study and understands that the sleep lab will call him to schedule this study. Pt verbalized understanding of results.

## 2018-06-07 NOTE — Telephone Encounter (Signed)
I called pt again to discuss his ONO results. No answer, left a message asking him to call me back.

## 2018-06-08 ENCOUNTER — Ambulatory Visit (INDEPENDENT_AMBULATORY_CARE_PROVIDER_SITE_OTHER): Payer: 59 | Admitting: Family Medicine

## 2018-06-08 ENCOUNTER — Encounter: Payer: Self-pay | Admitting: Family Medicine

## 2018-06-08 VITALS — BP 110/74 | HR 75 | Temp 98.4°F | Resp 16 | Ht 71.0 in | Wt 187.4 lb

## 2018-06-08 DIAGNOSIS — Z0000 Encounter for general adult medical examination without abnormal findings: Secondary | ICD-10-CM | POA: Diagnosis not present

## 2018-06-08 DIAGNOSIS — E663 Overweight: Secondary | ICD-10-CM | POA: Diagnosis not present

## 2018-06-08 DIAGNOSIS — F411 Generalized anxiety disorder: Secondary | ICD-10-CM

## 2018-06-08 DIAGNOSIS — Z79899 Other long term (current) drug therapy: Secondary | ICD-10-CM

## 2018-06-08 MED ORDER — PANTOPRAZOLE SODIUM 40 MG PO TBEC: 40.0000 mg | DELAYED_RELEASE_TABLET | Freq: Every day | ORAL | 3 refills | Status: DC

## 2018-06-08 MED ORDER — PAROXETINE HCL 40 MG PO TABS: ORAL_TABLET | ORAL | 1 refills | Status: DC

## 2018-06-08 MED ORDER — ARIPIPRAZOLE 5 MG PO TABS: 5.0000 mg | ORAL_TABLET | Freq: Every day | ORAL | 1 refills | Status: DC

## 2018-06-08 MED ORDER — CLONAZEPAM 1 MG PO TABS: ORAL_TABLET | ORAL | 1 refills | Status: DC
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
## 2018-06-08 NOTE — Patient Instructions (Signed)

## 2018-06-09 ENCOUNTER — Other Ambulatory Visit: Payer: 59

## 2018-06-11 LAB — PAIN MGMT, PROFILE 8 W/CONF, U
6 Acetylmorphine: NEGATIVE ng/mL (ref <10)
ALCOHOL METABOLITES: POSITIVE ng/mL — AB (ref <500)
ALPHAHYDROXYMIDAZOLAM: NEGATIVE ng/mL (ref <50)
ALPHAHYDROXYTRIAZOLAM: NEGATIVE ng/mL (ref <50)
Alphahydroxyalprazolam: NEGATIVE ng/mL (ref <25)
Aminoclonazepam: 201 ng/mL — ABNORMAL HIGH (ref <25)
Amphetamines: NEGATIVE ng/mL (ref <500)
BENZODIAZEPINES: POSITIVE ng/mL — AB (ref <100)
Buprenorphine, Urine: NEGATIVE ng/mL (ref <5)
COCAINE METABOLITE: NEGATIVE ng/mL (ref <150)
CREATININE: 104.5 mg/dL
ETHYL GLUCURONIDE (ETG): 141493 ng/mL — AB (ref <500)
ETHYL SULFATE (ETS): 25970 ng/mL — AB (ref <100)
HYDROXYETHYLFLURAZEPAM: NEGATIVE ng/mL (ref <50)
Lorazepam: NEGATIVE ng/mL (ref <50)
MARIJUANA METABOLITE: POSITIVE ng/mL — AB (ref <20)
MDMA: NEGATIVE ng/mL (ref <500)
Marijuana Metabolite: 547 ng/mL — ABNORMAL HIGH (ref <5)
NORDIAZEPAM: NEGATIVE ng/mL (ref <50)
OXAZEPAM: NEGATIVE ng/mL (ref <50)
Opiates: NEGATIVE ng/mL (ref <100)
Oxidant: NEGATIVE ug/mL (ref <200)
Oxycodone: NEGATIVE ng/mL (ref <100)
PH: 6.85 (ref 4.5–9.0)
Temazepam: NEGATIVE ng/mL (ref <50)

## 2018-06-15 ENCOUNTER — Ambulatory Visit (INDEPENDENT_AMBULATORY_CARE_PROVIDER_SITE_OTHER): Payer: 59 | Admitting: Neurology

## 2018-06-15 DIAGNOSIS — Z9989 Dependence on other enabling machines and devices: Secondary | ICD-10-CM

## 2018-06-15 DIAGNOSIS — G4734 Idiopathic sleep related nonobstructive alveolar hypoventilation: Secondary | ICD-10-CM | POA: Diagnosis not present

## 2018-06-15 DIAGNOSIS — G4733 Obstructive sleep apnea (adult) (pediatric): Secondary | ICD-10-CM

## 2018-06-20 ENCOUNTER — Telehealth: Payer: Self-pay

## 2018-06-20 NOTE — Progress Notes (Signed)
Since his last visit on 05/18/18, pt had an abn ONO in early Sept and therefore was asked to come in for a titration study:  Patient had a CPAP titration study on 06/15/18.  Please call and inform patient that I would like to change his autoPAP to CPAP of 13 cm. We will send the order to his DME. I believe, he will need a follow-up in about 10 weeks. He can see MM or CM as well, or myself.   Huston FoleySaima Aiya Keach, MD, PhD [facility])

## 2018-06-20 NOTE — Telephone Encounter (Signed)
-----   Message from Huston FoleySaima Athar, MD sent at 06/20/2018  7:45 AM EDT ----- Since his last visit on 05/18/18, pt had an abn ONO in early Sept and therefore was asked to come in for a titration study:  Patient had a CPAP titration study on 06/15/18.  Please call and inform patient that I would like to change his autoPAP to CPAP of 13 cm. We will send the order to his DME. I believe, he will need a follow-up in about 10 weeks. He can see MM or CM as well, or myself.   Huston FoleySaima Athar, MD, PhD [facility])

## 2018-06-20 NOTE — Procedures (Signed)
PATIENT'S NAME:  Tyrone Page, Tyrone Page DOB:      Feb 17, 1971      MR#:    [id number]     DATE OF RECORDING: 06/15/2018 REFERRING M.D.:  Nicoletta BaPhilip McGowen MD Study Performed:   CPAP  Titration HISTORY: 47 year old man with a history of hypertriglyceridemia, mildly overweight state, depression, anxiety, prior history of smoking, and recent ankle Fx with s/p surgery, who presents for a full night PAP titration study to optimize treatment for his OSA. He has been on autoPAP, but had an abnormal pulse oximetry test while on autoPAP recently. The patient's weight 191 pounds with a height of 71 (inches), resulting in a BMI of 26.9 kg/m2.   CURRENT MEDICATIONS: Abilify, Klonopin, Advil, Protonix, Paxil  PROCEDURE:  This is a multichannel digital polysomnogram utilizing the SomnoStar 11.2 system.  Electrodes and sensors were applied and monitored per AASM Specifications.   EEG, EOG, Chin and Limb EMG, were sampled at 200 Hz.  ECG, Snore and Nasal Pressure, Thermal Airflow, Respiratory Effort, CPAP Flow and Pressure, Oximetry was sampled at 50 Hz. Digital video and audio were recorded.      The patient was fitted with a medium Airfit F20 FFM. CPAP was initiated at 8 cmH20 with heated humidity and pressure was advanced to 13 cm H20 because of hypopneas, apneas and desaturations.  At a PAP pressure of 13 cmH20, there was a reduction of the AHI to 0 with non-supine REM sleep achieved and O2 nadir of 91%.  Lights Out was at 22:07 and Lights On at 05:03. Total recording time (TRT) was 417 minutes, with a total sleep time (TST) of 400 minutes. The patient's sleep latency was 5.5 minutes. REM latency was 115 minutes.  The sleep efficiency was 95.9 %.    SLEEP ARCHITECTURE: WASO (Wake after sleep onset)  was 16.5 minutes.  There were 10.5 minutes in Stage N1, 257 minutes Stage N2, 0 minutes Stage N3 and 132.5 minutes in Stage REM.  The percentage of Stage N1 was 2.6%, Stage N2 was 64.3%, which is increased, Stage N3 was absent, and Stage  R (REM sleep) was 33.1%, which is increased.  RESPIRATORY ANALYSIS:  There was a total of 25 respiratory events: 0 obstructive apneas, 0 central apneas and 0 mixed apneas with a total of 0 apneas and an apnea index (AI) of 0 /hour. There were 25 hypopneas with a hypopnea index of 3.8/hour. The patient also had 0 respiratory event related arousals (RERAs).      The total APNEA/HYPOPNEA INDEX (AHI) was 3.8 /hour and the total RESPIRATORY DISTURBANCE INDEX was 3.8 /hour  0 events occurred in REM sleep and 25 events in NREM. The REM AHI was 0 /hour versus a non-REM AHI of 5.6 /hour.  The patient spent 113.5 minutes of total sleep time in the supine position and 287 minutes in non-supine. The supine AHI was 13.2, versus a non-supine AHI of 0.0.  OXYGEN SATURATION & C02:  The baseline 02 saturation was 0%, with the lowest being 85%. Time spent below 89% saturation equaled 18 minutes.  PERIODIC LIMB MOVEMENTS:  The patient had a total of 0 Periodic Limb Movements. The Periodic Limb Movement (PLM) index was 0 and the PLM Arousal index was 0 /hour.   Audio and video analysis did not show any abnormal or unusual movements, behaviors, phonations or vocalizations. The patient took no bathroom breaks. The EKG was in keeping with normal sinus rhythm.  Post-study, the patient indicated that sleep was the same as usual.  DIAGNOSIS  1. Obstructive Sleep Apnea   PLANS/RECOMMENDATIONS:   1. This study demonstrates resolution of the patient's obstructive sleep apnea with CPAP therapy. I will, therefore, start the patient on home CPAP treatment at a pressure of 13 cm via medium FFM with heated humidity. The patient should be reminded to be fully compliant with PAP therapy to improve sleep related symptoms and decrease long term cardiovascular risks. The patient should be reminded, that it may take up to 3 months to get fully used to using PAP with all planned sleep. The earlier full compliance is achieved, the  better long term compliance tends to be. Please note that untreated obstructive sleep apnea may carry additional perioperative morbidity. Patients with significant obstructive sleep apnea should receive perioperative PAP therapy and the surgeons and particularly the anesthesiologist should be informed of the diagnosis and the severity of the sleep disordered breathing. 2. The patient should be cautioned not to drive, work at heights, or operate dangerous or heavy equipment when tired or sleepy. Review and reiteration of good sleep hygiene measures should be pursued with any patient. 3. The patient will be seen in follow-up in the sleep clinic at [facility] for discussion of the test results, symptom and treatment compliance review, further management strategies, etc. The referring provider will be notified of the test results.  I certify that I have reviewed the entire raw data recording prior to the issuance of this report in accordance with the Standards of Accreditation of the American Academy of Sleep Medicine (AASM)   Huston Foley, MD, PhD Diplomat, American Board of Neurology and Sleep Medicine (Neurology and Sleep Medicine)

## 2018-06-20 NOTE — Addendum Note (Signed)
Addended by: Huston FoleyATHAR, Marquis Down on: 06/20/2018 07:45 AM   Modules accepted: Orders

## 2018-06-20 NOTE — Telephone Encounter (Signed)
I called pt and advised him of his sleep study results. Pt is agreeable to the pressure change and understands that this order will be sent to [facility] today. I reminded pt of his follow up appt in November. Pt verbalized understanding of results. Pt had no questions at this time but was encouraged to call back if questions arise.

## 2018-06-26 DIAGNOSIS — G4733 Obstructive sleep apnea (adult) (pediatric): Secondary | ICD-10-CM | POA: Diagnosis not present

## 2018-07-14 ENCOUNTER — Encounter: Payer: Self-pay | Admitting: Neurology

## 2018-07-26 DIAGNOSIS — G4733 Obstructive sleep apnea (adult) (pediatric): Secondary | ICD-10-CM | POA: Diagnosis not present

## 2018-08-07 ENCOUNTER — Telehealth: Payer: Self-pay

## 2018-08-07 DIAGNOSIS — G4733 Obstructive sleep apnea (adult) (pediatric): Secondary | ICD-10-CM

## 2018-08-07 DIAGNOSIS — Z9989 Dependence on other enabling machines and devices: Principal | ICD-10-CM

## 2018-08-07 NOTE — Telephone Encounter (Signed)
I reviewed his recent compliance data for the past 30 days from 06/15/2018 through 07/14/2018. He has used his machine about 14 days only. Residual AHI is 4.4 per hour. Leak on the higher side. We can certainly try to reduce the pressure for better tolerance, I suggest we could go down to 12 cm. Please send order to DME and notify pt. I would like to remind him to use CPAP nightly.

## 2018-08-07 NOTE — Telephone Encounter (Signed)
Order for pressure change sent to [facility] via community message in [employer]. Confirmation received that the order transmitted was successful.  

## 2018-08-07 NOTE — Telephone Encounter (Signed)
I called pt. He feels that 13 cm H2O is too high and has been unable to tolerate his cpap. I advised him of the pressure change to 12 cm H2O. I reminded him to use his cpap every night for at least 4 hours and I reminded pt of his appt next week with Dr. Frances Furbish. Pt verbalized understanding of these recommendations.

## 2018-08-07 NOTE — Addendum Note (Signed)
Addended by: Huston Foley on: 08/07/2018 02:22 PM   Modules accepted: Orders

## 2018-08-07 NOTE — Telephone Encounter (Signed)
Pt left VM stating the recent increase in pressure is too much to tolerate. Asking for pressure to be reduced. Pt also asked for a return call back.

## 2018-08-07 NOTE — Telephone Encounter (Signed)
Current pressure is set at 13 cm H20. Will forward to Dr. Frances Furbish for review.

## 2018-08-13 ENCOUNTER — Encounter: Payer: Self-pay | Admitting: Neurology

## 2018-08-16 ENCOUNTER — Encounter: Payer: Self-pay | Admitting: Neurology

## 2018-08-16 ENCOUNTER — Ambulatory Visit (INDEPENDENT_AMBULATORY_CARE_PROVIDER_SITE_OTHER): Payer: 59 | Admitting: Neurology

## 2018-08-16 VITALS — BP 122/79 | HR 84 | Ht 71.0 in | Wt 189.0 lb

## 2018-08-16 DIAGNOSIS — Z9989 Dependence on other enabling machines and devices: Secondary | ICD-10-CM

## 2018-08-16 DIAGNOSIS — Z789 Other specified health status: Secondary | ICD-10-CM | POA: Diagnosis not present

## 2018-08-16 DIAGNOSIS — G4733 Obstructive sleep apnea (adult) (pediatric): Secondary | ICD-10-CM | POA: Diagnosis not present

## 2018-08-16 NOTE — Progress Notes (Signed)
Order for pressure change sent to [facility] via community message. Confirmation received that the order transmitted was successful.  

## 2018-08-16 NOTE — Patient Instructions (Addendum)
We will reduce you pressure to 11 cm, try a longer ramp time of about 20 min, starting ramp at 6 cm, not as low as 4 cm.   Also, please call or email in 6 weeks, so we can pull another compliance download on the new settings.
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
## 2018-08-26 DIAGNOSIS — G4733 Obstructive sleep apnea (adult) (pediatric): Secondary | ICD-10-CM | POA: Diagnosis not present

## 2018-09-25 DIAGNOSIS — G4733 Obstructive sleep apnea (adult) (pediatric): Secondary | ICD-10-CM | POA: Diagnosis not present

## 2018-10-26 DIAGNOSIS — G4733 Obstructive sleep apnea (adult) (pediatric): Secondary | ICD-10-CM | POA: Diagnosis not present

## 2018-11-25 DIAGNOSIS — G4733 Obstructive sleep apnea (adult) (pediatric): Secondary | ICD-10-CM | POA: Diagnosis not present

## 2018-12-06 ENCOUNTER — Ambulatory Visit: Payer: 59 | Admitting: Family Medicine
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
## 2018-12-29 ENCOUNTER — Emergency Department (HOSPITAL_COMMUNITY)
Admission: EM | Admit: 2018-12-29 | Discharge: 2018-12-29 | Disposition: A | Discharge status: Home / Self Care | Payer: 59 | Attending: Emergency Medicine | Admitting: Emergency Medicine

## 2018-12-29 ENCOUNTER — Emergency Department (HOSPITAL_BASED_OUTPATIENT_CLINIC_OR_DEPARTMENT_OTHER)
Admit: 2018-12-29 | Discharge: 2018-12-29 | Disposition: A | Discharge status: Home / Self Care | Payer: 59 | Attending: Emergency Medicine | Admitting: Emergency Medicine

## 2018-12-29 ENCOUNTER — Emergency Department (HOSPITAL_COMMUNITY): Payer: 59

## 2018-12-29 ENCOUNTER — Encounter (HOSPITAL_COMMUNITY): Payer: Self-pay

## 2018-12-29 ENCOUNTER — Other Ambulatory Visit: Payer: Self-pay

## 2018-12-29 ENCOUNTER — Ambulatory Visit: Payer: Self-pay | Admitting: *Deleted

## 2018-12-29 DIAGNOSIS — M7989 Other specified soft tissue disorders: Secondary | ICD-10-CM | POA: Diagnosis not present

## 2018-12-29 DIAGNOSIS — I825Y1 Chronic embolism and thrombosis of unspecified deep veins of right proximal lower extremity: Secondary | ICD-10-CM | POA: Diagnosis not present

## 2018-12-29 DIAGNOSIS — I82419 Acute embolism and thrombosis of unspecified femoral vein: Secondary | ICD-10-CM

## 2018-12-29 DIAGNOSIS — M79609 Pain in unspecified limb: Secondary | ICD-10-CM | POA: Diagnosis not present

## 2018-12-29 DIAGNOSIS — Z79899 Other long term (current) drug therapy: Secondary | ICD-10-CM | POA: Diagnosis not present

## 2018-12-29 DIAGNOSIS — R2241 Localized swelling, mass and lump, right lower limb: Secondary | ICD-10-CM | POA: Diagnosis present

## 2018-12-29 DIAGNOSIS — I824Y1 Acute embolism and thrombosis of unspecified deep veins of right proximal lower extremity: Secondary | ICD-10-CM

## 2018-12-29 DIAGNOSIS — Z87891 Personal history of nicotine dependence: Secondary | ICD-10-CM | POA: Insufficient documentation

## 2018-12-29 DIAGNOSIS — R05 Cough: Secondary | ICD-10-CM | POA: Diagnosis not present

## 2018-12-29 HISTORY — DX: Acute embolism and thrombosis of unspecified femoral vein: I82.419

## 2018-12-29 LAB — CBC WITH DIFFERENTIAL/PLATELET
Abs Immature Granulocytes: 0.02 10*3/uL (ref 0.00–0.07)
Basophils Absolute: 0.1 10*3/uL (ref 0.0–0.1)
Basophils Relative: 1 %
Eosinophils Absolute: 0.6 10*3/uL — ABNORMAL HIGH (ref 0.0–0.5)
Eosinophils Relative: 6 %
HCT: 54.7 % — ABNORMAL HIGH (ref 39.0–52.0)
Hemoglobin: 18.1 g/dL — ABNORMAL HIGH (ref 13.0–17.0)
Immature Granulocytes: 0 %
Lymphocytes Relative: 21 %
Lymphs Abs: 1.9 10*3/uL (ref 0.7–4.0)
MCH: 32.4 pg (ref 26.0–34.0)
MCHC: 33.1 g/dL (ref 30.0–36.0)
MCV: 98 fL (ref 80.0–100.0)
Monocytes Absolute: 0.6 10*3/uL (ref 0.1–1.0)
Monocytes Relative: 7 %
Neutro Abs: 5.8 10*3/uL (ref 1.7–7.7)
Neutrophils Relative %: 65 %
Platelets: 181 10*3/uL (ref 150–400)
RBC: 5.58 MIL/uL (ref 4.22–5.81)
RDW: 13.5 % (ref 11.5–15.5)
WBC: 9 10*3/uL (ref 4.0–10.5)
nRBC: 0 % (ref 0.0–0.2)

## 2018-12-29 LAB — BASIC METABOLIC PANEL
Anion gap: 10 (ref 5–15)
BUN: 9 mg/dL (ref 6–20)
CO2: 23 mmol/L (ref 22–32)
Calcium: 8.3 mg/dL — ABNORMAL LOW (ref 8.9–10.3)
Chloride: 106 mmol/L (ref 98–111)
Creatinine, Ser: 1.01 mg/dL (ref 0.61–1.24)
GFR calc Af Amer: >60 mL/min (ref >60)
GFR calc non Af Amer: >60 mL/min (ref >60)
Glucose, Bld: 138 mg/dL — ABNORMAL HIGH (ref 70–99)
Potassium: 4.4 mmol/L (ref 3.5–5.1)
Sodium: 139 mmol/L (ref 135–145)

## 2018-12-29 MED ORDER — RIVAROXABAN (XARELTO) VTE STARTER PACK (15 & 20 MG): ORAL_TABLET | ORAL | 0 refills | Status: DC
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
## 2018-12-29 NOTE — Telephone Encounter (Signed)
Unilateral leg swelling (doubled in size) should be seen emergently.

## 2018-12-29 NOTE — Progress Notes (Signed)
Right lower extremity venous duplex has been completed. Preliminary results can be found in CV Proc through chart review.  Results were given to Dr. Rubin Payor.  12/29/18 5:06 PM Olen Cordial RVT

## 2018-12-29 NOTE — Telephone Encounter (Signed)
Pt was called and told to go directly to ED. Pt verbalized understanding
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
## 2018-12-29 NOTE — Discharge Instructions (Signed)
Return to the ER for chest pain or shortness of breath.

## 2018-12-29 NOTE — Telephone Encounter (Signed)
Agree with triage. Reviewed ED eval. Prelim reading of venous doppler u/s shows acute right leg DVT from femoral vein down to peroneal vein.

## 2018-12-29 NOTE — ED Triage Notes (Signed)
R leg swelling and pain for a couple of weeks. Denies sitting for long periods of time or travel.

## 2018-12-29 NOTE — Telephone Encounter (Signed)
Please advise on triage call

## 2019-01-26 ENCOUNTER — Other Ambulatory Visit: Payer: Self-pay

## 2019-01-26 MED ORDER — ARIPIPRAZOLE 5 MG PO TABS: 5.0000 mg | ORAL_TABLET | Freq: Every day | ORAL | 0 refills | Status: DC

## 2019-02-13 ENCOUNTER — Telehealth: Payer: Self-pay

## 2019-02-13 NOTE — Telephone Encounter (Signed)
I called pt to discuss converting his visit tomorrow to a virtual visit. No answer, left a message asking him to call me back.

## 2019-02-14 ENCOUNTER — Ambulatory Visit: Payer: 59 | Admitting: Neurology

## 2019-03-12 ENCOUNTER — Encounter (HOSPITAL_COMMUNITY): Payer: Self-pay | Admitting: Emergency Medicine

## 2019-03-12 ENCOUNTER — Other Ambulatory Visit: Payer: Self-pay

## 2019-03-12 ENCOUNTER — Emergency Department (HOSPITAL_COMMUNITY)
Admission: EM | Admit: 2019-03-12 | Discharge: 2019-03-12 | Disposition: A | Discharge status: Home / Self Care | Payer: 59 | Attending: Emergency Medicine | Admitting: Emergency Medicine

## 2019-03-12 ENCOUNTER — Emergency Department (HOSPITAL_COMMUNITY): Payer: 59

## 2019-03-12 ENCOUNTER — Ambulatory Visit: Payer: Self-pay | Admitting: Hematology

## 2019-03-12 DIAGNOSIS — Z87891 Personal history of nicotine dependence: Secondary | ICD-10-CM | POA: Diagnosis not present

## 2019-03-12 DIAGNOSIS — Z86718 Personal history of other venous thrombosis and embolism: Secondary | ICD-10-CM | POA: Insufficient documentation

## 2019-03-12 DIAGNOSIS — F1290 Cannabis use, unspecified, uncomplicated: Secondary | ICD-10-CM | POA: Diagnosis not present

## 2019-03-12 DIAGNOSIS — Z79899 Other long term (current) drug therapy: Secondary | ICD-10-CM | POA: Insufficient documentation

## 2019-03-12 DIAGNOSIS — M546 Pain in thoracic spine: Secondary | ICD-10-CM | POA: Diagnosis present

## 2019-03-12 MED ORDER — OXYCODONE HCL 5 MG PO TABS: 5.0000 mg | ORAL_TABLET | ORAL | 0 refills | Status: AC | PRN

## 2019-03-12 MED ORDER — RIVAROXABAN 20 MG PO TABS: 20.0000 mg | ORAL_TABLET | Freq: Every day | ORAL | 0 refills | Status: DC

## 2019-03-12 MED ORDER — CYCLOBENZAPRINE HCL 10 MG PO TABS: 10.0000 mg | ORAL_TABLET | Freq: Three times a day (TID) | ORAL | 0 refills | Status: AC

## 2019-03-12 NOTE — ED Triage Notes (Signed)
Pt here from home with c/o upper back pain times 6 days , pt did have a blood clot in his leg and was placed on blood thinners but did not do his follow up , no sob sats 96% on RA

## 2019-03-12 NOTE — Telephone Encounter (Signed)
Patient states he had a recent history of blood clot.  Completed medication regimen but did not follow up with PCP.  Patient C/O of constant pain that 8/10 on pain scale. Pain is worse when he breathes or coughs. / Patient directed to the ED for follow up to R/O another blood clot or PE. / Patient verbalizes understanding and agrees Answer Assessment - Initial Assessment Questions 1. ONSET: "When did the pain begin?"      5 days ago 2. LOCATION: "Where does it hurt?" (upper, mid or lower back) In between the shoulder blades 3. SEVERITY: "How bad is the pain?"  8  4. PATTERN: "Is the pain constant?" (e.g., yes, no; constant, intermittent)    Constant pain that gets worse with coughing 5. RADIATION: "Does the pain shoot into your legs or elsewhere?" no 6. CAUSE:  "What do you think is causing the back pain?"      unknown 7. BACK OVERUSE:  "Any recent lifting of heavy objects, strenuous work or exercise?"    Does a lot of lifting for work, with in crease workload this week 8. MEDICATIONS: "What have you taken so far for the pain?" (e.g., nothing, acetaminophen, NSAIDS)   Ibuprofen, but no relief 9. NEUROLOGIC SYMPTOMS: "Do you have any weakness, numbness, or problems with bowel/bladder control?"     no 10. OTHER SYMPTOMS: "Do you have any other symptoms?" (e.g., fever, abdominal pain, burning with urination, blood in urine)      none  Protocols used: BACK PAIN-A-AH
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
## 2019-03-12 NOTE — Discharge Instructions (Signed)
You were seen in the ER for right trapezius and thoracic back pain.  Given your symptoms and exam, this is most likely a muscular type spasm or strain.   For pain and inflammation you can use a combination of ibuprofen and acetaminophen.  Take [phone] mg acetaminophen (tylenol) every 6 hours.  Do not exceed 4,000 mg acetaminophen.  Do not take acetaminophen if you have liver disease. For break through and/or severe pain despite ibuprofen and acetaminophen regimen, take 5 mg oxycodone every 4 hours.  Oxycodone is a narcotic pain medication that has risk of overdose, death, dependence and abuse. Mild and expected side effects include nausea, stomach upset, drowsiness, constipation. Do not consume alcohol, drive or use heavy machinery while taking this medication. Do not leave unattended around children. Flush any remaining pills that you do not use and do not share.  The emergency department has a strict policy regarding prescription of narcotic medications. We prescribe a short course for acute, new pain or injuries. We are unable to refill this medication in the emergency department for chronic pain or repeatedly.  Refill need to be done by specialist or primary care provider or pain clinic.  Contact your primary care provider or specialist for chronic pain management and refill on narcotic medications. Flexeril 10 mg every 8 hours for muscle spasm and tightness.   Head, massage, stretch.  Given your history of right leg blood clot, technically you are at higher risk for a pulmonary embolism.  However, based on your symptoms and exam this was thought to be less likely.  A CT of your chest would confirm a pulmonary embolism but would not change the management.  You need to resume Xarelto 20 mg every day.  You need to follow-up with your primary care doctor as soon as possible for refills of this as needed.

## 2019-03-12 NOTE — ED Notes (Signed)
Patient verbalizes understanding of discharge instructions. Opportunity for questioning and answers were provided. Armband removed by staff, pt discharged from ED.  

## 2019-03-13 ENCOUNTER — Encounter: Payer: Self-pay | Admitting: Family Medicine

## 2019-03-13 NOTE — Telephone Encounter (Signed)
Noted  

## 2019-05-29 DIAGNOSIS — I2699 Other pulmonary embolism without acute cor pulmonale: Secondary | ICD-10-CM

## 2019-05-29 HISTORY — DX: Other pulmonary embolism without acute cor pulmonale: I26.99

## 2019-06-06 ENCOUNTER — Other Ambulatory Visit: Payer: Self-pay

## 2019-06-06 MED ORDER — PAROXETINE HCL 40 MG PO TABS: ORAL_TABLET | ORAL | 0 refills | Status: DC

## 2019-06-07 ENCOUNTER — Emergency Department (HOSPITAL_BASED_OUTPATIENT_CLINIC_OR_DEPARTMENT_OTHER)
Admission: EM | Admit: 2019-06-07 | Discharge: 2019-06-07 | Disposition: A | Discharge status: Home / Self Care | Payer: 59 | Attending: Emergency Medicine | Admitting: Emergency Medicine

## 2019-06-07 ENCOUNTER — Encounter (HOSPITAL_BASED_OUTPATIENT_CLINIC_OR_DEPARTMENT_OTHER): Payer: Self-pay

## 2019-06-07 ENCOUNTER — Ambulatory Visit (INDEPENDENT_AMBULATORY_CARE_PROVIDER_SITE_OTHER): Payer: 59 | Admitting: Internal Medicine

## 2019-06-07 ENCOUNTER — Other Ambulatory Visit: Payer: Self-pay

## 2019-06-07 ENCOUNTER — Emergency Department (HOSPITAL_BASED_OUTPATIENT_CLINIC_OR_DEPARTMENT_OTHER): Payer: 59

## 2019-06-07 ENCOUNTER — Encounter: Payer: Self-pay | Admitting: Internal Medicine

## 2019-06-07 VITALS — BP 124/77 | HR 102 | Temp 96.8°F | Resp 16 | Ht 71.0 in | Wt 169.4 lb

## 2019-06-07 DIAGNOSIS — E785 Hyperlipidemia, unspecified: Secondary | ICD-10-CM | POA: Insufficient documentation

## 2019-06-07 DIAGNOSIS — R000 Tachycardia, unspecified: Secondary | ICD-10-CM

## 2019-06-07 DIAGNOSIS — I2699 Other pulmonary embolism without acute cor pulmonale: Secondary | ICD-10-CM | POA: Insufficient documentation

## 2019-06-07 DIAGNOSIS — I82411 Acute embolism and thrombosis of right femoral vein: Secondary | ICD-10-CM

## 2019-06-07 DIAGNOSIS — Z79899 Other long term (current) drug therapy: Secondary | ICD-10-CM | POA: Diagnosis not present

## 2019-06-07 DIAGNOSIS — F17210 Nicotine dependence, cigarettes, uncomplicated: Secondary | ICD-10-CM | POA: Diagnosis not present

## 2019-06-07 DIAGNOSIS — R609 Edema, unspecified: Secondary | ICD-10-CM | POA: Diagnosis present

## 2019-06-07 LAB — CBC WITH DIFFERENTIAL/PLATELET
Abs Immature Granulocytes: 0.02 10*3/uL (ref 0.00–0.07)
Basophils Absolute: 0.1 10*3/uL (ref 0.0–0.1)
Basophils Relative: 1 %
Eosinophils Absolute: 0.9 10*3/uL — ABNORMAL HIGH (ref 0.0–0.5)
Eosinophils Relative: 9 %
HCT: 48.4 % (ref 39.0–52.0)
Hemoglobin: 16.1 g/dL (ref 13.0–17.0)
Immature Granulocytes: 0 %
Lymphocytes Relative: 18 %
Lymphs Abs: 1.8 10*3/uL (ref 0.7–4.0)
MCH: 33.8 pg (ref 26.0–34.0)
MCHC: 33.3 g/dL (ref 30.0–36.0)
MCV: 101.7 fL — ABNORMAL HIGH (ref 80.0–100.0)
Monocytes Absolute: 0.9 10*3/uL (ref 0.1–1.0)
Monocytes Relative: 9 %
Neutro Abs: 6.2 10*3/uL (ref 1.7–7.7)
Neutrophils Relative %: 63 %
Platelets: 191 10*3/uL (ref 150–400)
RBC: 4.76 MIL/uL (ref 4.22–5.81)
RDW: 14.3 % (ref 11.5–15.5)
WBC: 10 10*3/uL (ref 4.0–10.5)
nRBC: 0 % (ref 0.0–0.2)

## 2019-06-07 LAB — BASIC METABOLIC PANEL
Anion gap: 10 (ref 5–15)
BUN: 12 mg/dL (ref 6–20)
CO2: 25 mmol/L (ref 22–32)
Calcium: 9 mg/dL (ref 8.9–10.3)
Chloride: 104 mmol/L (ref 98–111)
Creatinine, Ser: 0.85 mg/dL (ref 0.61–1.24)
GFR calc Af Amer: >60 mL/min (ref >60)
GFR calc non Af Amer: >60 mL/min (ref >60)
Glucose, Bld: 115 mg/dL — ABNORMAL HIGH (ref 70–99)
Potassium: 3.8 mmol/L (ref 3.5–5.1)
Sodium: 139 mmol/L (ref 135–145)

## 2019-06-07 MED ORDER — RIVAROXABAN (XARELTO) VTE STARTER PACK (15 & 20 MG): ORAL_TABLET | ORAL | 0 refills | Status: DC

## 2019-06-07 MED ORDER — IOHEXOL 350 MG/ML SOLN
100.0000 mL | Freq: Once | INTRAVENOUS | Status: AC | PRN
  Administered 2019-06-07: 15:00:00 100 mL via INTRAVENOUS

## 2019-06-07 MED ORDER — HEPARIN (PORCINE) 25000 UT/250ML-% IV SOLN
1400.0000 [IU]/h | INTRAVENOUS | Status: DC
  Filled 2019-06-07: qty 250

## 2019-06-07 MED ORDER — HEPARIN BOLUS VIA INFUSION: 5000.0000 [IU] | Freq: Once | INTRAVENOUS | Status: DC
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
## 2019-06-07 NOTE — ED Notes (Signed)
Pt signed out ama

## 2019-06-07 NOTE — Patient Instructions (Signed)
Please go to the ER  

## 2019-06-07 NOTE — Progress Notes (Signed)
Pre visit review using our clinic review tool, if applicable. No additional management support is needed unless otherwise documented below in the visit note. 

## 2019-06-07 NOTE — Discharge Instructions (Signed)
You have multiple blood clots that can be life-threatening, these can cause death or injury to your lung tissue and strain on your heart, you also had a blood clot in your right leg.  Since you have chosen to leave today please make sure you are taking your Xarelto.  You can return to the emergency department for further treatment at any time.

## 2019-06-07 NOTE — ED Notes (Signed)
EDP to bedside to discuss plan of care with pt. Pt wanting to refuse heparin and admission at this time.

## 2019-06-07 NOTE — ED Triage Notes (Addendum)
Pt c/o right LE pain x 3 days-recent hx of DVT and PE with no f/u-pt from PCP office today-NAD-to triage in w/c

## 2019-06-07 NOTE — ED Notes (Signed)
Patient transported to CT 
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
## 2019-06-07 NOTE — ED Notes (Signed)
Pt on monitor 

## 2019-06-22 ENCOUNTER — Encounter: Payer: Self-pay | Admitting: Family Medicine

## 2019-06-22 ENCOUNTER — Other Ambulatory Visit: Payer: Self-pay

## 2019-06-22 ENCOUNTER — Ambulatory Visit (INDEPENDENT_AMBULATORY_CARE_PROVIDER_SITE_OTHER): Payer: 59 | Admitting: Family Medicine

## 2019-06-22 DIAGNOSIS — I82401 Acute embolism and thrombosis of unspecified deep veins of right lower extremity: Secondary | ICD-10-CM | POA: Diagnosis not present

## 2019-06-22 DIAGNOSIS — I2699 Other pulmonary embolism without acute cor pulmonale: Secondary | ICD-10-CM | POA: Diagnosis not present
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
## 2019-06-25 ENCOUNTER — Telehealth: Payer: Self-pay | Admitting: Hematology & Oncology

## 2019-06-25 NOTE — Telephone Encounter (Signed)
lmom to inform patient of new patient appt 10/6 at 12 pm.

## 2019-06-27 ENCOUNTER — Telehealth: Payer: Self-pay | Admitting: Family Medicine

## 2019-06-27 MED ORDER — RIVAROXABAN 20 MG PO TABS: 20.0000 mg | ORAL_TABLET | Freq: Every day | ORAL | 4 refills | Status: DC

## 2019-06-27 NOTE — Telephone Encounter (Signed)
Patient was notified.

## 2019-06-27 NOTE — Telephone Encounter (Signed)
Patient will run out of xarelto in 1.5 weeks. Please send Rx to [facility].

## 2019-06-27 NOTE — Telephone Encounter (Signed)
RF request for Xarelto LOV: 06/22/19 Next ov: 12/13/19 Last written: 06/07/19 (51,0)  Please advise. Referral was placed to hematology on 06/22/19 for further work up.

## 2019-06-28 ENCOUNTER — Other Ambulatory Visit: Payer: Self-pay | Admitting: Hematology

## 2019-06-28 DIAGNOSIS — I2699 Other pulmonary embolism without acute cor pulmonale: Secondary | ICD-10-CM | POA: Insufficient documentation

## 2019-06-28 DIAGNOSIS — I82401 Acute embolism and thrombosis of unspecified deep veins of right lower extremity: Secondary | ICD-10-CM

## 2019-06-28 DIAGNOSIS — I82409 Acute embolism and thrombosis of unspecified deep veins of unspecified lower extremity: Secondary | ICD-10-CM | POA: Insufficient documentation
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
## 2019-07-03 ENCOUNTER — Inpatient Hospital Stay: Payer: 59

## 2019-07-03 ENCOUNTER — Inpatient Hospital Stay: Payer: 59 | Attending: Hematology | Admitting: Hematology

## 2019-10-05 ENCOUNTER — Other Ambulatory Visit: Payer: Self-pay | Admitting: Family Medicine

## 2019-12-13 ENCOUNTER — Ambulatory Visit: Payer: 59 | Admitting: Family Medicine

## 2019-12-25 ENCOUNTER — Emergency Department (HOSPITAL_COMMUNITY)
Admission: EM | Admit: 2019-12-25 | Discharge: 2019-12-25 | Disposition: A | Discharge status: Home / Self Care | Payer: 59 | Attending: Emergency Medicine | Admitting: Emergency Medicine

## 2019-12-25 ENCOUNTER — Emergency Department (HOSPITAL_COMMUNITY): Payer: 59

## 2019-12-25 ENCOUNTER — Encounter (HOSPITAL_COMMUNITY): Payer: Self-pay | Admitting: Emergency Medicine

## 2019-12-25 ENCOUNTER — Telehealth: Payer: Self-pay

## 2019-12-25 ENCOUNTER — Other Ambulatory Visit: Payer: Self-pay

## 2019-12-25 DIAGNOSIS — R600 Localized edema: Secondary | ICD-10-CM | POA: Diagnosis not present

## 2019-12-25 DIAGNOSIS — Z86718 Personal history of other venous thrombosis and embolism: Secondary | ICD-10-CM | POA: Insufficient documentation

## 2019-12-25 DIAGNOSIS — Z7901 Long term (current) use of anticoagulants: Secondary | ICD-10-CM | POA: Insufficient documentation

## 2019-12-25 DIAGNOSIS — R0602 Shortness of breath: Secondary | ICD-10-CM | POA: Insufficient documentation

## 2019-12-25 DIAGNOSIS — Z79899 Other long term (current) drug therapy: Secondary | ICD-10-CM | POA: Diagnosis not present

## 2019-12-25 DIAGNOSIS — F17210 Nicotine dependence, cigarettes, uncomplicated: Secondary | ICD-10-CM | POA: Diagnosis not present

## 2019-12-25 DIAGNOSIS — Z86711 Personal history of pulmonary embolism: Secondary | ICD-10-CM | POA: Insufficient documentation

## 2019-12-25 DIAGNOSIS — I2694 Multiple subsegmental pulmonary emboli without acute cor pulmonale: Secondary | ICD-10-CM | POA: Insufficient documentation

## 2019-12-25 DIAGNOSIS — R05 Cough: Secondary | ICD-10-CM | POA: Insufficient documentation

## 2019-12-25 DIAGNOSIS — R0789 Other chest pain: Secondary | ICD-10-CM | POA: Diagnosis present

## 2019-12-25 LAB — CBC
HCT: 47.8 % (ref 39.0–52.0)
Hemoglobin: 16.2 g/dL (ref 13.0–17.0)
MCH: 33.8 pg (ref 26.0–34.0)
MCHC: 33.9 g/dL (ref 30.0–36.0)
MCV: 99.6 fL (ref 80.0–100.0)
Platelets: 216 10*3/uL (ref 150–400)
RBC: 4.8 MIL/uL (ref 4.22–5.81)
RDW: 12.3 % (ref 11.5–15.5)
WBC: 8.1 10*3/uL (ref 4.0–10.5)
nRBC: 0 % (ref 0.0–0.2)

## 2019-12-25 LAB — BASIC METABOLIC PANEL
Anion gap: 11 (ref 5–15)
BUN: 13 mg/dL (ref 6–20)
CO2: 23 mmol/L (ref 22–32)
Calcium: 9.3 mg/dL (ref 8.9–10.3)
Chloride: 105 mmol/L (ref 98–111)
Creatinine, Ser: 0.98 mg/dL (ref 0.61–1.24)
GFR calc Af Amer: >60 mL/min (ref >60)
GFR calc non Af Amer: >60 mL/min (ref >60)
Glucose, Bld: 105 mg/dL — ABNORMAL HIGH (ref 70–99)
Potassium: 4.4 mmol/L (ref 3.5–5.1)
Sodium: 139 mmol/L (ref 135–145)

## 2019-12-25 LAB — TROPONIN I (HIGH SENSITIVITY)
Troponin I (High Sensitivity): 3 ng/L (ref <18)
Troponin I (High Sensitivity): 19 ng/L — ABNORMAL HIGH (ref <18)

## 2019-12-25 MED ORDER — SODIUM CHLORIDE 0.9% FLUSH: 3.0000 mL | Freq: Once | INTRAVENOUS | Status: DC

## 2019-12-25 MED ORDER — IOHEXOL 350 MG/ML SOLN
100.0000 mL | Freq: Once | INTRAVENOUS | Status: AC | PRN
  Administered 2019-12-25: 21:00:00 100 mL via INTRAVENOUS

## 2019-12-25 MED ORDER — METHOCARBAMOL 500 MG PO TABS: 500.0000 mg | ORAL_TABLET | Freq: Two times a day (BID) | ORAL | 0 refills | Status: DC

## 2019-12-25 NOTE — Telephone Encounter (Signed)
Patient called in pain on right side and right arm. Transferred call to triage nurse.
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
## 2019-12-25 NOTE — Discharge Instructions (Addendum)
Continue taking your Xarelto every day as directed. Follow up with your primary care provider for further workup of your blood clots. You can take Tylenol as well as a muscle relaxer provided to help with your symptoms.  Heat and massage may also help. Return to the ED if you start to experience worsening symptoms, chest pain, shortness of breath, loss of consciousness or head injuries.

## 2019-12-25 NOTE — ED Triage Notes (Signed)
Pt reports being diagnosed with DVT and PE a few months ago and started on xarelto. States he is mostly compliant with meds. Pain in shoulder blades and lungs. Denies SOB.

## 2019-12-25 NOTE — Telephone Encounter (Signed)
I agree with triage to ED

## 2019-12-25 NOTE — Telephone Encounter (Signed)
Patient is headed to ED currently. Nothing further needed

## 2019-12-25 NOTE — Telephone Encounter (Signed)
No openings with PCP for today. Please advise, thanks.  Patient Name: Tyrone Page Gender: Male DOB: 04-28-1971 Age: 49 Y 7 M 18 D Return Phone Number: [phone] (Primary), [phone] (Secondary) Address[place] [address] Client [facility] Day - Client Client Site [facility] [place] - Day Physician Tyrone Page - MD Contact Type Call Who Is Calling Patient / Member / Family / Caregiver Call Type Triage / Clinical Relationship To Patient Self Return Phone Number [phone] (Primary) Chief Complaint Weakness, Generalized Reason for Call Symptomatic / Request for Health Information Initial Comment Caller states he is experiencing weakness, right side pain, and arm pain. GOTO Facility Not Listed [place] [facility] No Nurse Assessment Nurse: Tyrone Sells, RN, Tyrone Page Date/Time Tyrone Page Time): 12/25/2019 8:59:53 AM Confirm and document reason for call. If symptomatic, describe symptoms. ---Caller states he is experiencing weakness, right side pain, and arm pain. Constant pain, right arm is weak. Hx of blood clots. Recent blood clots. Xarelto in last 6 mos. Going on for 2 weeks. Has the patient had close contact with a person known or suspected to have the novel coronavirus illness OR traveled / lives in area with major community spread (including international travel) in the last 14 days from the onset of symptoms? * If Asymptomatic, screen for exposure and travel within the last 14 days. ---No Does the patient have any new or worsening symptoms? ---Yes Will a triage be completed? ---Yes Related visit to physician within the last 2 weeks? ---No Does the PT have any chronic conditions? (i.e. diabetes, asthma, this includes High risk factors for pregnancy, etc.) ---Yes List chronic conditions. ---blood clots, xarelto Is this a behavioral health or substance abuse call? ---No Guidelines Guideline Title Affirmed Question  Affirmed Notes Nurse Date/Time (Eastern Time) Chest Pain History of prior "blood clot" in leg or lungs (i.e., deep vein thrombosis, pulmonary embolism) Tyrone Page 12/25/2019 9:01:46 AM 12/25/2019 9:03:43 AM Go to ED Now Yes Tyrone Sells, RN, Tyrone Page Disagree/Comply Comply Caller Understands Yes PreDisposition Go to ED Care Advice Given Per Guideline GO TO ED NOW: * You need to be seen in the Emergency Department. ANOTHER ADULT SHOULD DRIVE: * It is better and safer if another adult drives instead of you. * Go to the ED at ___________ Hospital. * Leave now. Drive carefully. NOTHING BY MOUTH: * Do not eat or drink anything for now. BRING MEDICINES: * Please bring a list of your current medicines when you go to the Emergency Department (ER). * Passes out or becomes too weak to stand CALL EMS IF: * Severe difficulty breathing occurs * You become worse. CARE ADVICE given per Chest Pain (Adult) guideline. Referrals GO TO FACILITY OTHER - SPECIFY

## 2019-12-26 ENCOUNTER — Ambulatory Visit (INDEPENDENT_AMBULATORY_CARE_PROVIDER_SITE_OTHER): Payer: 59 | Admitting: Family Medicine

## 2019-12-26 ENCOUNTER — Telehealth: Payer: Self-pay

## 2019-12-26 ENCOUNTER — Encounter: Payer: Self-pay | Admitting: Family Medicine

## 2019-12-26 VITALS — BP 106/74 | HR 94 | Temp 98.0°F | Resp 16 | Ht 71.0 in | Wt 170.4 lb

## 2019-12-26 DIAGNOSIS — M5414 Radiculopathy, thoracic region: Secondary | ICD-10-CM

## 2019-12-26 DIAGNOSIS — M898X1 Other specified disorders of bone, shoulder: Secondary | ICD-10-CM

## 2019-12-26 DIAGNOSIS — Z86711 Personal history of pulmonary embolism: Secondary | ICD-10-CM

## 2019-12-26 DIAGNOSIS — Z7901 Long term (current) use of anticoagulants: Secondary | ICD-10-CM

## 2019-12-26 DIAGNOSIS — Z86718 Personal history of other venous thrombosis and embolism: Secondary | ICD-10-CM | POA: Diagnosis not present

## 2019-12-26 MED ORDER — HYDROCODONE-ACETAMINOPHEN 5-325 MG PO TABS: 1.0000 | ORAL_TABLET | Freq: Four times a day (QID) | ORAL | 0 refills | Status: AC | PRN

## 2019-12-26 NOTE — Telephone Encounter (Signed)
Please advise, thanks. Only opening is for 4pm today

## 2019-12-26 NOTE — Telephone Encounter (Signed)
Please get pt on schedule today for 4pm in office, thanks.

## 2019-12-26 NOTE — Telephone Encounter (Signed)
OK to put on at 4 today
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
## 2019-12-26 NOTE — Telephone Encounter (Signed)
Patient wife called (DPR) regarding patient's recent visit to [facility] ER yesterday.   Multiple subsegmental pulmonary emboli without acute cor pulmonale (HCC)  Patient was told to follow up with PCP today. Dr. Milinda Cave does not have any available appts today or tomorrow unless we bring him in at [facility].  Please review schedule and advise when patient can be seen.  Patient can be reached at [phone].  Sending note as high priority.

## 2019-12-27 ENCOUNTER — Other Ambulatory Visit: Payer: Self-pay

## 2019-12-27 ENCOUNTER — Ambulatory Visit (HOSPITAL_BASED_OUTPATIENT_CLINIC_OR_DEPARTMENT_OTHER)
Admission: RE | Admit: 2019-12-27 | Discharge: 2019-12-27 | Disposition: A | Discharge status: Home / Self Care | Payer: 59 | Source: Ambulatory Visit | Attending: Family Medicine | Admitting: Family Medicine

## 2019-12-27 DIAGNOSIS — M898X1 Other specified disorders of bone, shoulder: Secondary | ICD-10-CM | POA: Insufficient documentation

## 2020-01-02 ENCOUNTER — Other Ambulatory Visit: Payer: Self-pay | Admitting: Family Medicine

## 2020-01-02 ENCOUNTER — Telehealth: Payer: Self-pay

## 2020-01-02 DIAGNOSIS — M898X1 Other specified disorders of bone, shoulder: Secondary | ICD-10-CM

## 2020-01-02 NOTE — Telephone Encounter (Signed)
LM for pt or pt's wife, Almira Coaster to return call regarding imaging and further recommendations.

## 2020-01-02 NOTE — Telephone Encounter (Signed)
OK. I have no choice but to refer him to sports medicine at this time. I'll submit order now.

## 2020-01-02 NOTE — Telephone Encounter (Signed)
MR thoracic spine wo contrast ordered on 12/26/19 determined to be not medically necessary. Paperwork regarding this placed on PCP desk for review.

## 2020-01-03 NOTE — Telephone Encounter (Signed)
LM for pt to returncall

## 2020-01-04 NOTE — Telephone Encounter (Signed)
LM for pt  or pt's wife, Almira Coaster to return call. Letter will be sent to patient regarding this and referring office has tried calling patient to schedule. If pt calls back, please advise of this.

## 2020-06-08 IMAGING — DX DG ANKLE COMPLETE 3+V*L*
3 series · 3 of 3 positions shown · non-contrast
Comparison: None.

CLINICAL DATA: Fall off ladder, pain, swelling

EXAM:
LEFT ANKLE COMPLETE - 3+ VIEW

[ankle ap]
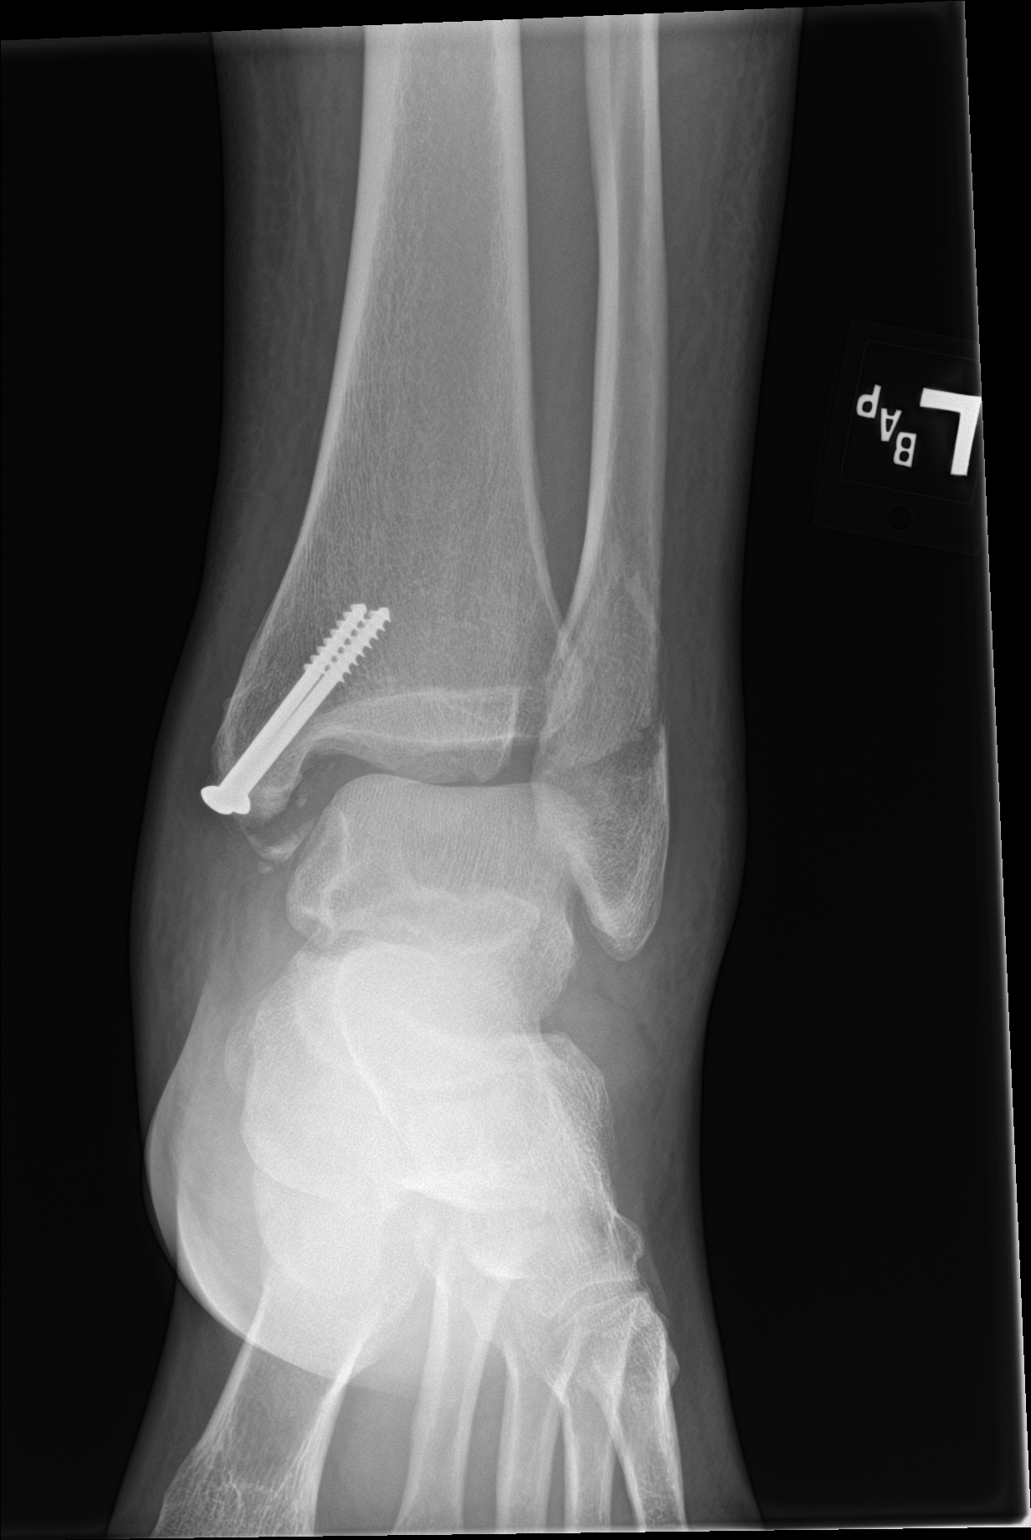

[ankle obl]
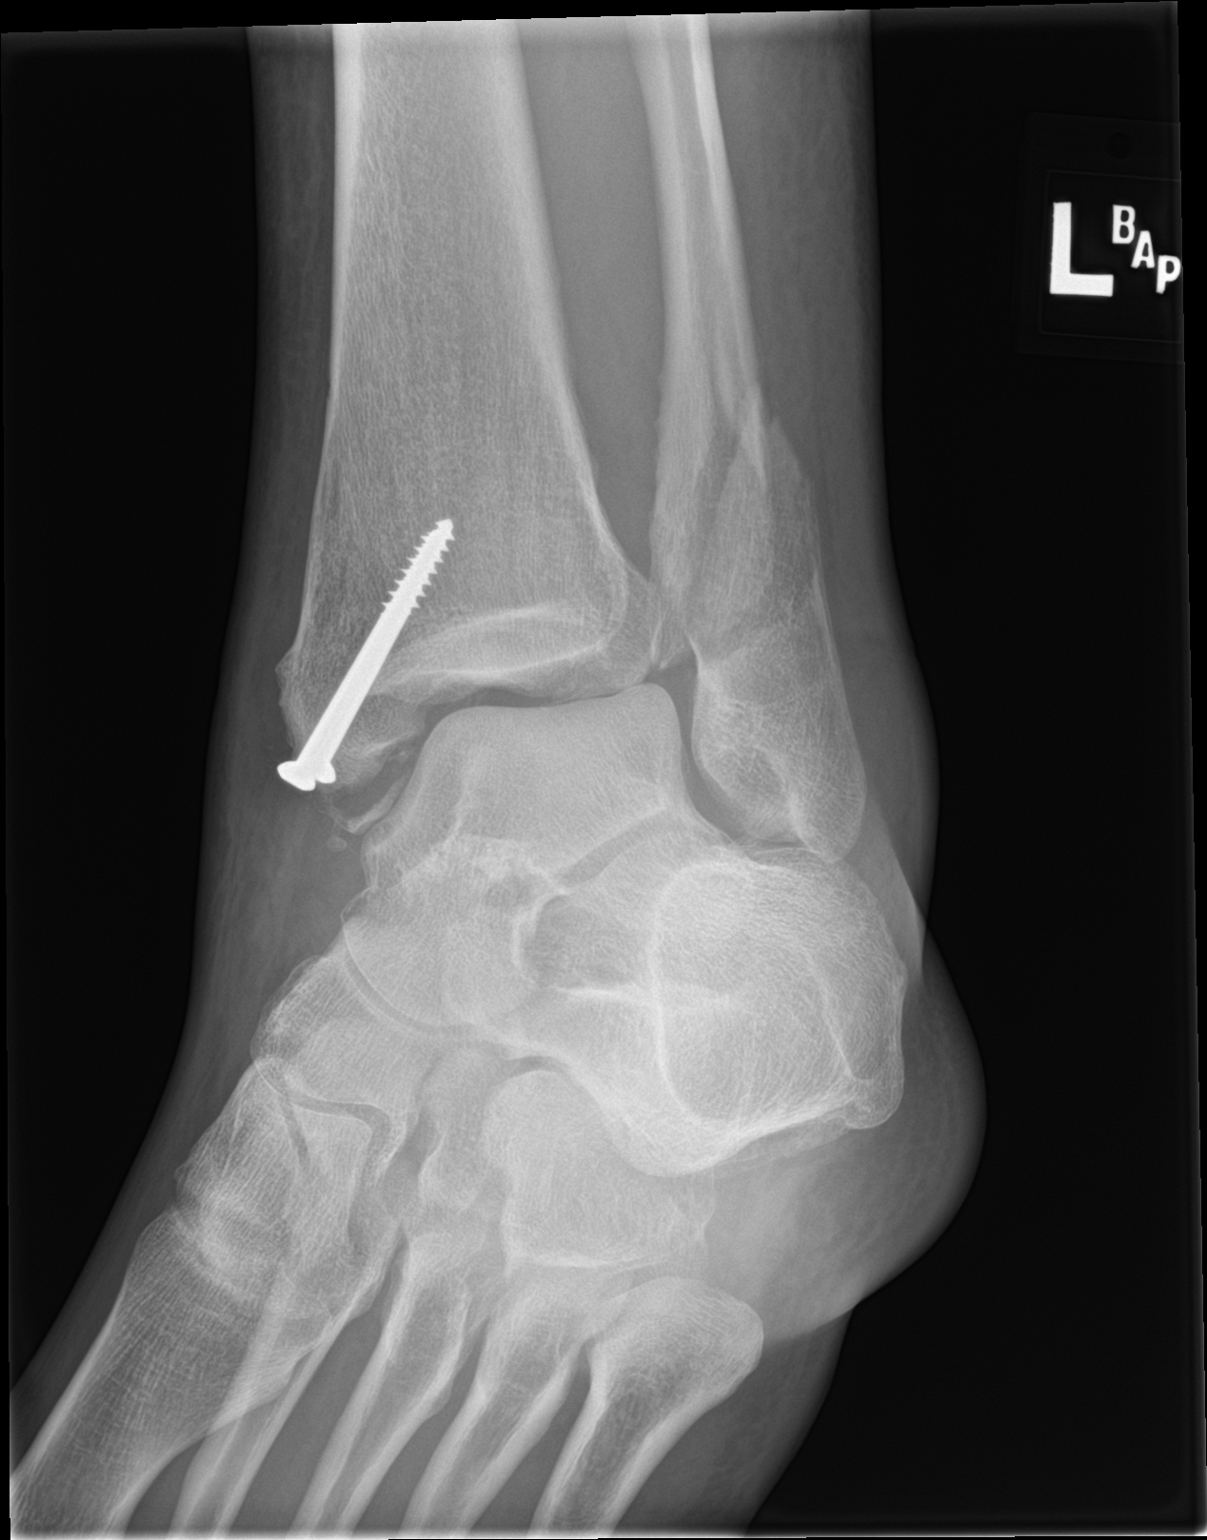

[ankle lat]
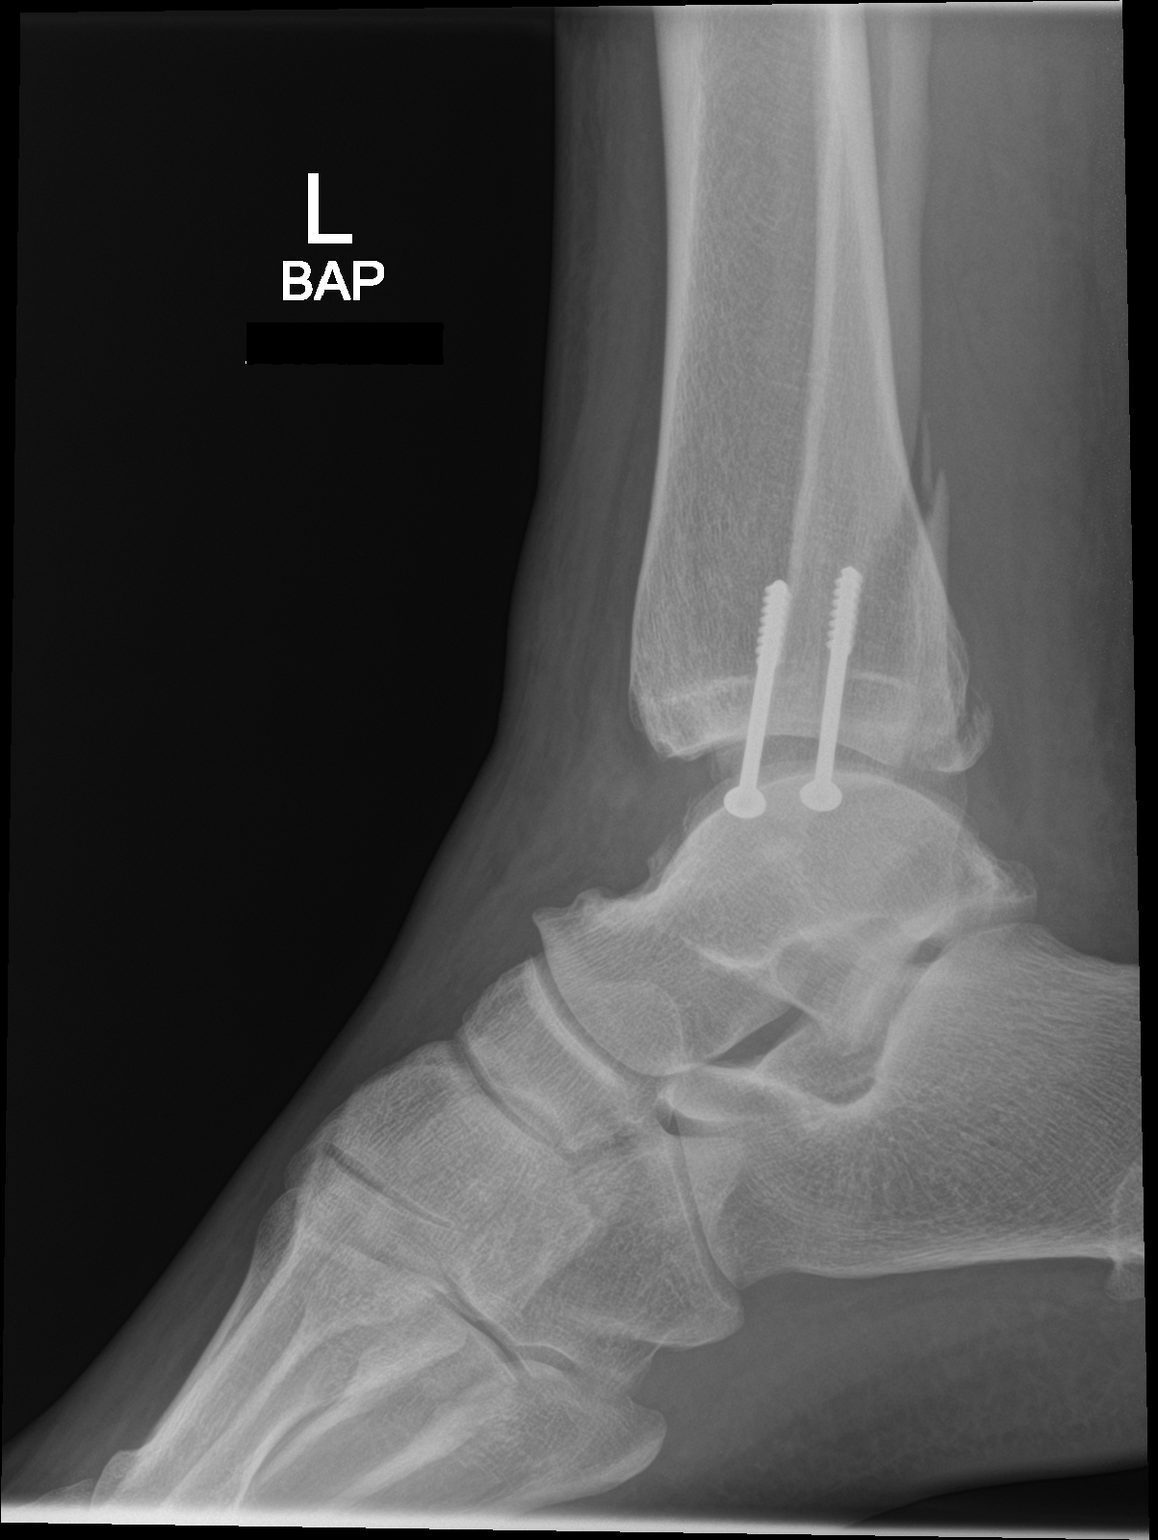

[3 of 3 positions shown; findings below may reference images not displayed]

FINDINGS: Medial malleolar screws are in place. Fracture through the tip of
the medial malleolus. Oblique fracture through the distal fibula at
the level of the ankle mortise. Posterior malleolar fracture noted
on the lateral view. Widening of the medial ankle mortise. Diffuse
soft tissue swelling
IMPRESSION: Avulsion fracture off the tip of the medial malleolus. Oblique
fracture through the distal fibula. Fracture through the posterior
malleolus. There appears to be disruption of the ankle mortise.

## 2021-09-16 ENCOUNTER — Other Ambulatory Visit: Payer: Self-pay | Admitting: Family Medicine

## 2021-09-18 ENCOUNTER — Other Ambulatory Visit: Payer: Self-pay | Admitting: Family Medicine

## 2022-09-22 NOTE — Progress Notes (Signed)
This encounter was created in error - please disregard.
